# Patient Record
Sex: Male | Born: 1975 | Race: White | Hispanic: No | Smoking: Former smoker
Health system: Southern US, Community
[De-identification: ages and names within clinical notes are randomized; demographics above are authoritative.]

## PROBLEM LIST (undated history)

## (undated) DIAGNOSIS — R011 Cardiac murmur, unspecified: Secondary | ICD-10-CM

## (undated) DIAGNOSIS — Q231 Congenital insufficiency of aortic valve: Secondary | ICD-10-CM

## (undated) DIAGNOSIS — K219 Gastro-esophageal reflux disease without esophagitis: Secondary | ICD-10-CM

## (undated) DIAGNOSIS — Q2381 Bicuspid aortic valve: Secondary | ICD-10-CM

## (undated) DIAGNOSIS — I10 Essential (primary) hypertension: Secondary | ICD-10-CM

## (undated) DIAGNOSIS — E669 Obesity, unspecified: Secondary | ICD-10-CM

## (undated) DIAGNOSIS — Q251 Coarctation of aorta: Secondary | ICD-10-CM

## (undated) DIAGNOSIS — E785 Hyperlipidemia, unspecified: Secondary | ICD-10-CM

## (undated) DIAGNOSIS — Z8679 Personal history of other diseases of the circulatory system: Secondary | ICD-10-CM

## (undated) HISTORY — DX: Personal history of other diseases of the circulatory system: Z86.79

## (undated) HISTORY — DX: Congenital insufficiency of aortic valve: Q23.1

## (undated) HISTORY — DX: Bicuspid aortic valve: Q23.81

## (undated) HISTORY — PX: WISDOM TOOTH EXTRACTION: SHX21

## (undated) HISTORY — DX: Hyperlipidemia, unspecified: E78.5

## (undated) HISTORY — DX: Coarctation of aorta: Q25.1

## (undated) HISTORY — PX: PERCUTANEOUS BALLOON VALVULOPLASTY: SHX270

## (undated) HISTORY — DX: Essential (primary) hypertension: I10

## (undated) HISTORY — DX: Obesity, unspecified: E66.9

## (undated) HISTORY — DX: Gastro-esophageal reflux disease without esophagitis: K21.9

## (undated) HISTORY — PX: OTHER SURGICAL HISTORY: SHX169

---
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
## 2016-11-20 ENCOUNTER — Other Ambulatory Visit: Payer: Self-pay | Admitting: Family

## 2016-11-20 ENCOUNTER — Other Ambulatory Visit: Payer: Self-pay | Admitting: Family Medicine

## 2016-11-20 DIAGNOSIS — R748 Abnormal levels of other serum enzymes: Secondary | ICD-10-CM

## 2019-09-16 ENCOUNTER — Other Ambulatory Visit (HOSPITAL_COMMUNITY): Payer: Self-pay | Admitting: Family Medicine

## 2019-09-16 DIAGNOSIS — R011 Cardiac murmur, unspecified: Secondary | ICD-10-CM

## 2019-09-24 ENCOUNTER — Other Ambulatory Visit: Payer: Self-pay

## 2019-09-24 ENCOUNTER — Ambulatory Visit (HOSPITAL_COMMUNITY): Payer: 59 | Attending: Cardiology

## 2019-09-24 DIAGNOSIS — R011 Cardiac murmur, unspecified: Secondary | ICD-10-CM | POA: Diagnosis present

## 2019-09-28 ENCOUNTER — Other Ambulatory Visit (HOSPITAL_COMMUNITY): Payer: Self-pay

## 2019-10-12 ENCOUNTER — Other Ambulatory Visit: Payer: Self-pay

## 2019-10-12 ENCOUNTER — Encounter: Payer: Self-pay | Admitting: Cardiology

## 2019-10-12 ENCOUNTER — Ambulatory Visit (INDEPENDENT_AMBULATORY_CARE_PROVIDER_SITE_OTHER): Payer: PRIVATE HEALTH INSURANCE | Admitting: Cardiology

## 2019-10-12 VITALS — BP 150/76 | HR 88 | Ht 66.0 in | Wt 233.0 lb

## 2019-10-12 DIAGNOSIS — Q251 Coarctation of aorta: Secondary | ICD-10-CM | POA: Diagnosis not present

## 2019-10-12 DIAGNOSIS — Q2381 Bicuspid aortic valve: Secondary | ICD-10-CM

## 2019-10-12 DIAGNOSIS — E782 Mixed hyperlipidemia: Secondary | ICD-10-CM

## 2019-10-12 DIAGNOSIS — I351 Nonrheumatic aortic (valve) insufficiency: Secondary | ICD-10-CM

## 2019-10-12 DIAGNOSIS — Z8774 Personal history of (corrected) congenital malformations of heart and circulatory system: Secondary | ICD-10-CM

## 2019-10-12 DIAGNOSIS — Q231 Congenital insufficiency of aortic valve: Secondary | ICD-10-CM | POA: Diagnosis not present

## 2019-10-12 DIAGNOSIS — I10 Essential (primary) hypertension: Secondary | ICD-10-CM

## 2019-10-12 HISTORY — DX: Personal history of (corrected) congenital malformations of heart and circulatory system: Z87.74

## 2019-10-12 HISTORY — DX: Essential (primary) hypertension: I10

## 2019-10-12 HISTORY — DX: Nonrheumatic aortic (valve) insufficiency: I35.1

## 2019-10-12 HISTORY — DX: Mixed hyperlipidemia: E78.2

## 2019-10-12 NOTE — Progress Notes (Signed)
Cardiology Office Note:    Date:  10/12/2019   ID:  Dean Molina, DOB 08/02/1976, MRN [id number]  PCP:  Tally Joe, MD  Cardiologist:  Thomasene Ripple, DO  Electrophysiologist:  None   Referring MD: Tally Joe, MD   The patient was referred for aortic regurgitation.  History of Present Illness:    Dean Molina is a 43 y.o. male with a complex of history that includes a repair of aortic coarctation at 72 months old, Bicuspid aortic valve, Congenital pulmonic stenosis and is status post balloon valvuloplasty at age 1, hypertension and  Hyperlipidemia.   The patient is here today to be evaluated due to an abnormal echocardiogram. During his visit today he is aware of the aortic regurgitation. He denies any chest pain, shortness of breath,, lightheadedness or dizziness.   NO complaints today.   Past Medical History:  Diagnosis Date  . Acid reflux   . Bicuspid aortic valve   . Coarctation of the aorta, complex   . GERD (gastroesophageal reflux disease)   . Hyperlipidemia   . Hypertension   . Obesity (BMI 30-39.9)   . Personal history of pulmonic valve stenosis     Past Surgical History:  Procedure Laterality Date  . CARDIAC CATHETERIZATION  1988  . PERCUTANEOUS BALLOON VALVULOPLASTY    . status repair of coarctation of the aorta    . SUPRAVALVULAR AORTIC STENOSIS REPAIR  1977    Current Medications: Current Meds  Medication Sig  . GARLIC PO Take by mouth daily.  Marland Kitchen lovastatin (MEVACOR) 40 MG tablet 40 mg daily.  . Misc Natural Products (GINSENG COMPLEX PO) Take by mouth daily.  . Multiple Vitamins-Minerals (MULTIVITAMIN WITH MINERALS) tablet Take 1 tablet by mouth daily.  Marland Kitchen omeprazole (PRILOSEC) 20 MG capsule 20 mg daily.  . valsartan-hydrochlorothiazide (DIOVAN-HCT) 160-25 MG tablet daily.  . Zinc 25 MG TABS Take 25 mg by mouth daily.     Allergies:   Tamiflu [oseltamivir phosphate]   Social History   Socioeconomic History  . Marital status: Married   Spouse name: Not on file  . Number of children: Not on file  . Years of education: Not on file  . Highest education level: Not on file  Occupational History  . Not on file  Social Needs  . Financial resource strain: Not on file  . Food insecurity    Worry: Not on file    Inability: Not on file  . Transportation needs    Medical: Not on file    Non-medical: Not on file  Tobacco Use  . Smoking status: Former Smoker    Quit date: 10/11/2009    Years since quitting: 10.0  . Smokeless tobacco: Never Used  Substance and Sexual Activity  . Alcohol use: Not Currently    Comment: Rarely  . Drug use: Never  . Sexual activity: Not on file  Lifestyle  . Physical activity    Days per week: Not on file    Minutes per session: Not on file  . Stress: Not on file  Relationships  . Social Musician on phone: Not on file    Gets together: Not on file    Attends religious service: Not on file    Active member of club or organization: Not on file    Attends meetings of clubs or organizations: Not on file    Relationship status: Not on file  Other Topics Concern  . Not on file  Social History Narrative  .  Not on file     Family History: The patient's family history includes Arthritis in his mother; Diabetes in his father; Hypertension in his father and mother.  ROS:   Review of Systems  Constitution: Negative for decreased appetite, fever and weight gain.  HENT: Negative for congestion, ear discharge, hoarse voice and sore throat.   Eyes: Negative for discharge, redness, vision loss in right eye and visual halos.  Cardiovascular: Negative for chest pain, dyspnea on exertion, leg swelling, orthopnea and palpitations.  Respiratory: Negative for cough, hemoptysis, shortness of breath and snoring.   Endocrine: Negative for heat intolerance and polyphagia.  Hematologic/Lymphatic: Negative for bleeding problem. Does not bruise/bleed easily.  Skin: Negative for flushing, nail  changes, rash and suspicious lesions.  Musculoskeletal: Negative for arthritis, joint pain, muscle cramps, myalgias, neck pain and stiffness.  Gastrointestinal: Negative for abdominal pain, bowel incontinence, diarrhea and excessive appetite.  Genitourinary: Negative for decreased libido, genital sores and incomplete emptying.  Neurological: Negative for brief paralysis, focal weakness, headaches and loss of balance.  Psychiatric/Behavioral: Negative for altered mental status, depression and suicidal ideas.  Allergic/Immunologic: Negative for HIV exposure and persistent infections.    EKGs/Labs/Other Studies Reviewed:    The following studies were reviewed today:   EKG:  The ekg ordered today demonstrates Sinus rhythm, Hr 83 bpm with left sided interventricular conduct delay.  Transthoracic echocardiogram: 09/24/2019  1. Left ventricular ejection fraction, by visual estimation, is 55 to 60%. The left ventricle has normal function. There is no left ventricular hypertrophy.  2. Left ventricular diastolic function could not be evaluated.  3. Moderately dilated left ventricular internal cavity size.  4. Global right ventricle has normal systolic function.The right ventricular size is normal.  5. Left atrial size was normal.  6. Right atrial size was normal.  7. The mitral valve is normal in structure. Trace mitral valve regurgitation. No evidence of mitral stenosis.  8. The tricuspid valve is normal in structure. Tricuspid valve regurgitation is trivial.  9. Aortic valve regurgitation is severe. 10. The aortic valve is bicuspid. Aortic valve regurgitation is severe. No evidence of aortic valve sclerosis or stenosis. 11. The pulmonic valve was normal in structure. Pulmonic valve regurgitation is trivial. 12. Aortic dilatation noted. 13. There is mild dilatation of the aortic root measuring 41 mm. 14. The inferior vena cava is normal in size with greater than 50% respiratory variability,  suggesting right atrial pressure of 3 mmHg. 15. Normal LV function; moderate LVE; mildly dilated aortic root; probable bicuspid aortic valve with severe eccentric AI; elevated velocity (2.3 m/s) in descending aorta; suggest CTA or MRA to R/O coarctation.   Recent Labs: No results found for requested labs within last 8760 hours.  Recent Lipid Panel No results found for: CHOL, TRIG, HDL, CHOLHDL, VLDL, LDLCALC, LDLDIRECT  Physical Exam:    VS:  BP (!) 150/76 (BP Location: Right Arm, Patient Position: Sitting, Cuff Size: Normal)   Pulse 88   Ht 5\' 6"  (1.676 m)   Wt 233 lb (105.7 kg)   SpO2 94%   BMI 37.61 kg/m     Wt Readings from Last 3 Encounters:  10/12/19 233 lb (105.7 kg)     GEN: Well nourished, well developed in no acute distress HEENT: Normal NECK: No JVD; No carotid bruits LYMPHATICS: No lymphadenopathy CARDIAC: S1S2 noted,RRR, 2/6 soft diastolic murmurs, rubs, gallops RESPIRATORY:  Clear to auscultation without rales, wheezing or rhonchi  ABDOMEN: Soft, non-tender, non-distended, +bowel sounds, no guarding. EXTREMITIES: No edema, No  cyanosis, no clubbing MUSCULOSKELETAL:  No edema; No deformity  SKIN: Warm and dry NEUROLOGIC:  Alert and oriented x 3, non-focal PSYCHIATRIC:  Normal affect, good insight  ASSESSMENT:    1. Nonrheumatic aortic valve insufficiency   2. Bicuspid aortic valve   3. Coarctation of the aorta, complex   4. S/P repair of coarctation of aorta    PLAN:    1. I was able to also independently review his TTE - he does have severe Aortic regurgitation. He is asymptomatic and his LVEF is greater that 50%, and his LV cavity size in ( LVEDD is 49mm). He is a patient with complex history. I will send him to be evaluated by CT surgery. He will need a CTA to evaluate his repaired coarctation of the aorta. I will wait until after he visit with CT surgery before obtaining his CTA. He may also need a RHC/LHC.   2. For now we will continue to work on keeping  his blood pressure with a target less that 130/80 mmhg.  He will remain on the Valsartan 160 mg and HCTZ 25 mg daily.   3. His TTE will be repeat in 3-6 months if no plans for intervention.    The patient is in agreement with the above plan. The patient left the office in stable condition.  The patient will follow up in 3 months.   Medication Adjustments/Labs and Tests Ordered: Current medicines are reviewed at length with the patient today.  Concerns regarding medicines are outlined above.  Orders Placed This Encounter  Procedures  . Ambulatory referral to Cardiothoracic Surgery  . EKG 12-Lead   No orders of the defined types were placed in this encounter.   Patient Instructions  Medication Instructions:  Your physician recommends that you continue on your current medications as directed. Please refer to the Current Medication list given to you today.  *If you need a refill on your cardiac medications before your next appointment, please call your pharmacy*  Lab Work None  If you have labs (blood work) drawn today and your tests are completely normal, you will receive your results only by: Marland Kitchen MyChart Message (if you have MyChart) OR . A paper copy in the mail If you have any lab test that is abnormal or we need to change your treatment, we will call you to review the results.  Testing/Procedures: None  Follow-Up: At [facility], you and your health needs are our priority.  As part of our continuing mission to provide you with exceptional heart care, we have created designated Provider Care Teams.  These Care Teams include your primary Cardiologist (physician) and Advanced Practice Providers (APPs -  Physician Assistants and Nurse Practitioners) who all work together to provide you with the care you need, when you need it.  Your next appointment:   3 month(s)  The format for your next appointment:   In Person  Provider:   Berniece Salines, DO  Other Instructions You are  being referred to Dr Roxy Manns a cardiothoracic surgeon. They will call you with a date and time for an appointment.     Adopting a Healthy Lifestyle.  Know what a healthy weight is for you (roughly BMI <25) and aim to maintain this   Aim for 7+ servings of fruits and vegetables daily   65-80+ fluid ounces of water or unsweet tea for healthy kidneys   Limit to max 1 drink of alcohol per day; avoid smoking/tobacco   Limit animal fats in  diet for cholesterol and heart health - choose grass fed whenever available   Avoid highly processed foods, and foods high in saturated/trans fats   Aim for low stress - take time to unwind and care for your mental health   Aim for 150 min of moderate intensity exercise weekly for heart health, and weights twice weekly for bone health   Aim for 7-9 hours of sleep daily   When it comes to diets, agreement about the perfect plan isnt easy to find, even among the experts. Experts at the [facility] [employer] developed an idea known as the Healthy Eating Plate. Just imagine a plate divided into logical, healthy portions.   The emphasis is on diet quality:   Load up on vegetables and fruits - one-half of your plate: Aim for color and variety, and remember that potatoes dont count.   Go for whole grains - one-quarter of your plate: Whole wheat, barley, wheat berries, quinoa, oats, brown rice, and foods made with them. If you Molina pasta, go with whole wheat pasta.   Protein power - one-quarter of your plate: Fish, chicken, beans, and nuts are all healthy, versatile protein sources. Limit red meat.   The diet, however, does go beyond the plate, offering a few other suggestions.   Use healthy plant oils, such as olive, canola, soy, corn, sunflower and peanut. Check the labels, and avoid partially hydrogenated oil, which have unhealthy trans fats.   If youre thirsty, drink water. Coffee and tea are good in moderation, but skip sugary drinks and  limit milk and dairy products to one or two daily servings.   The type of carbohydrate in the diet is more important than the amount. Some sources of carbohydrates, such as vegetables, fruits, whole grains, and beans-are healthier than others.   Finally, stay active  Signed, Thomasene Ripple, DO  10/12/2019 8:15 PM    [facility]

## 2019-10-12 NOTE — Patient Instructions (Signed)
Medication Instructions:  Your physician recommends that you continue on your current medications as directed. Please refer to the Current Medication list given to you today.  *If you need a refill on your cardiac medications before your next appointment, please call your pharmacy*  Lab Work None  If you have labs (blood work) drawn today and your tests are completely normal, you will receive your results only by: Marland Kitchen MyChart Message (if you have MyChart) OR . A paper copy in the mail If you have any lab test that is abnormal or we need to change your treatment, we will call you to review the results.  Testing/Procedures: None  Follow-Up: At [facility], you and your health needs are our priority.  As part of our continuing mission to provide you with exceptional heart care, we have created designated Provider Care Teams.  These Care Teams include your primary Cardiologist (physician) and Advanced Practice Providers (APPs -  Physician Assistants and Nurse Practitioners) who all work together to provide you with the care you need, when you need it.  Your next appointment:   3 month(s)  The format for your next appointment:   In Person  Provider:   Berniece Salines, DO  Other Instructions You are being referred to Dr Roxy Manns a cardiothoracic surgeon. They will call you with a date and time for an appointment.

## 2019-10-13 ENCOUNTER — Telehealth: Payer: Self-pay

## 2019-10-13 NOTE — Telephone Encounter (Signed)
NOTES ON FILE FROM [facility] [phone], REFERRAL SENT TO SCHEDULING

## 2019-11-12 ENCOUNTER — Telehealth: Payer: Self-pay | Admitting: Cardiology

## 2019-11-12 ENCOUNTER — Telehealth: Payer: Self-pay | Admitting: *Deleted

## 2019-11-12 NOTE — Telephone Encounter (Signed)
message in regards to referral from provider:TOBB,Kardie, the deparitment was not entered for [facility] so referral did not show up in [employer]que.  We were not aware of this referral until we received staff message.

## 2019-11-12 NOTE — Telephone Encounter (Signed)
Spoke with [facility]. The referral did not interface. They will call him to arrange appointment.  Called to update the patient. Called confirmed number on file. Someone picked up the phone and said the number was wrong.  Called patient's wife, EC on file.  Left message to have the patient call back to discuss referral and to update phone number.

## 2019-11-12 NOTE — Telephone Encounter (Signed)
Message sent to [facility] for update on referral.

## 2019-11-12 NOTE — Telephone Encounter (Signed)
Patient is asking about a referral for surgery

## 2019-11-13 NOTE — Telephone Encounter (Signed)
The patient has been scheduled with Dr. Cornelius Moras 11/30/19.

## 2019-11-30 ENCOUNTER — Telehealth (HOSPITAL_COMMUNITY): Payer: Self-pay | Admitting: Emergency Medicine

## 2019-11-30 ENCOUNTER — Institutional Professional Consult (permissible substitution): Payer: 59 | Admitting: Thoracic Surgery (Cardiothoracic Vascular Surgery)

## 2019-11-30 ENCOUNTER — Other Ambulatory Visit: Payer: Self-pay

## 2019-11-30 ENCOUNTER — Encounter: Payer: Self-pay | Admitting: Thoracic Surgery (Cardiothoracic Vascular Surgery)

## 2019-11-30 ENCOUNTER — Other Ambulatory Visit: Payer: Self-pay | Admitting: Thoracic Surgery (Cardiothoracic Vascular Surgery)

## 2019-11-30 ENCOUNTER — Telehealth: Payer: Self-pay | Admitting: *Deleted

## 2019-11-30 VITALS — BP 175/77 | HR 79 | Temp 97.9°F | Resp 20 | Ht 66.0 in | Wt 230.0 lb

## 2019-11-30 DIAGNOSIS — I350 Nonrheumatic aortic (valve) stenosis: Secondary | ICD-10-CM | POA: Diagnosis not present

## 2019-11-30 DIAGNOSIS — Q231 Congenital insufficiency of aortic valve: Secondary | ICD-10-CM

## 2019-11-30 DIAGNOSIS — I351 Nonrheumatic aortic (valve) insufficiency: Secondary | ICD-10-CM | POA: Diagnosis not present

## 2019-11-30 NOTE — Telephone Encounter (Signed)
error 

## 2019-11-30 NOTE — Telephone Encounter (Signed)
Spoke with the patient. He will need a TEE. He cannot do 1/28. He stated that he would prefer to have it the week of February 8th due to his schedule.

## 2019-11-30 NOTE — Patient Instructions (Signed)

## 2019-11-30 NOTE — Progress Notes (Signed)
I can do on Thursday 01/28. Should have time to get his COVID screen by then. Misty Stanley, can you please schedule?

## 2019-11-30 NOTE — Progress Notes (Signed)
[address]       [place] [address]             [phone]     CARDIOTHORACIC SURGERY CONSULTATION REPORT  Referring Provider is Berniece Salines, DO PCP is Antony Contras, MD  Chief Complaint  Patient presents with  . 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  Patient underwent primary repair of coarctation of the aorta via left thoracotomy at age 81 months at [facility].  He was noted to have bicuspid aortic valve and was followed intermittently through childhood at [employer] as the patient's father was active duty.  At age 42 the patient underwent balloon valvuloplasty of the pulmonic valve at [facility].  He has not had formal cardiac follow-up for quite some time and was recently referred to Dr. Harriet Masson for consultation.  Transthoracic echocardiogram performed September 24, 2019 revealed findings consistent with likely bicuspid aortic valve with severe aortic insufficiency.  Cardiothoracic surgical consultation was requested.  Patient is married but recently separated from his wife.  He lives alone locally in [facility].  He works full-time as a Dance movement psychotherapist for [employer].  He does not exercise on a regular basis.  He has been moderately obese for the majority of his adult life.  He enjoys playing the guitar.  He does not have any children.  He denies any particular physical limitations.  He states that he has noted some tendency for decreased energy but  he denies any symptoms of exertional shortness of breath.  He has had some mild lower extremity edema which has been attributed to varicose veins and improved with use of compression stockings.  He denies any history of chest pain or chest tightness either with activity or at rest.  He reports occasional palpitations without dizzy spells or syncope.  He reports no specific physical limitations.  He does sleep in a recliner because of symptoms of reflux.  Past Medical History:  Diagnosis Date  . Acid reflux   . Aortic insufficiency due to bicuspid aortic valve   . Bicuspid aortic valve   . Coarctation of the aorta, s/p repair   . GERD (gastroesophageal reflux disease)   . Hyperlipidemia   . Hypertension   . Obesity (BMI 30-39.9)   . pulmonic valve stenosis s/p balloon valvuloplasty     Past Surgical History:  Procedure Laterality Date  . CARDIAC CATHETERIZATION  1988  . PERCUTANEOUS BALLOON VALVULOPLASTY     pulmonic valve  . status repair of coarctation of the aorta    . SUPRAVALVULAR AORTIC STENOSIS REPAIR  1977    Family History  Problem Relation Age of Onset  . Hypertension Mother   . Arthritis Mother   . Diabetes Father   . Hypertension Father     Social History   Socioeconomic History  . Marital status: Legally Separated    Spouse name: Not on file  . Number of children: Not on file  . Years of education: Not on file  . Highest  education level: Not on file  Occupational History  . Not on file  Tobacco Use  . Smoking status: Former Smoker    Quit date: 10/11/2009    Years since quitting: 10.1  . Smokeless tobacco: Never Used  Substance and Sexual Activity  . Alcohol use: Not Currently    Comment: Rarely  . Drug use: Never  . Sexual activity: Not on file  Other Topics Concern  . Not on file  Social History Narrative  . Not on file   Social Determinants of Health   Financial Resource Strain:   . Difficulty of Paying Living Expenses: Not on file  Food  Insecurity:   . Worried About Programme researcher, broadcasting/film/videounning Out of Food in the Last Year: Not on file  . Ran Out of Food in the Last Year: Not on file  Transportation Needs:   . Lack of Transportation (Medical): Not on file  . Lack of Transportation (Non-Medical): Not on file  Physical Activity:   . Days of Exercise per Week: Not on file  . Minutes of Exercise per Session: Not on file  Stress:   . Feeling of Stress : Not on file  Social Connections:   . Frequency of Communication with Friends and Family: Not on file  . Frequency of Social Gatherings with Friends and Family: Not on file  . Attends Religious Services: Not on file  . Active Member of Clubs or Organizations: Not on file  . Attends BankerClub or Organization Meetings: Not on file  . Marital Status: Not on file  Intimate Partner Violence:   . Fear of Current or Ex-Partner: Not on file  . Emotionally Abused: Not on file  . Physically Abused: Not on file  . Sexually Abused: Not on file    Current Outpatient Medications  Medication Sig Dispense Refill  . GARLIC PO Take by mouth daily.    Marland Kitchen. lovastatin (MEVACOR) 40 MG tablet 40 mg daily.    . Misc Natural Products (GINSENG COMPLEX PO) Take by mouth daily.    . Multiple Vitamins-Minerals (MULTIVITAMIN WITH MINERALS) tablet Take 1 tablet by mouth daily.    Marland Kitchen. omeprazole (PRILOSEC) 20 MG capsule 20 mg daily.    . valsartan-hydrochlorothiazide (DIOVAN-HCT) 160-25 MG tablet daily.    . Zinc 25 MG TABS Take 50 mg by mouth daily.      No current facility-administered medications for this visit.    Allergies  Allergen Reactions  . Tamiflu [Oseltamivir Phosphate] Nausea And Vomiting      Review of Systems:   General:  normal appetite, decreased energy, no weight gain, no weight loss, no fever  Cardiac:  no chest pain with exertion, no chest pain at rest, no SOB with exertion, no resting SOB, no PND, no orthopnea, + occasional palpitations, no arrhythmia, no atrial fibrillation, mild LE edema, no  dizzy spells, no syncope  Respiratory:  no shortness of breath, no home oxygen, no productive cough, no dry cough, no bronchitis, no wheezing, no hemoptysis, no asthma, no pain with inspiration or cough, no sleep apnea but + snoring, no CPAP at night  GI:   no difficulty swallowing, + reflux, no frequent heartburn, no hiatal hernia, no abdominal pain, no constipation, no diarrhea, no hematochezia, no hematemesis, no melena  GU:   no dysuria,  no frequency, no urinary tract infection, no hematuria, no enlarged prostate, no kidney stones, no kidney disease  Vascular:  no pain suggestive of claudication, no pain in feet, no leg cramps, + varicose veins,  no DVT, no non-healing foot ulcer  Neuro:   no stroke, no TIA's, no seizures, no headaches, no temporary blindness one eye,  no slurred speech, no peripheral neuropathy, no chronic pain, no instability of gait, no memory/cognitive dysfunction  Musculoskeletal: no arthritis, no joint swelling, no myalgias, no difficulty walking, normal mobility   Skin:   no rash, no itching, no skin infections, no pressure sores or ulcerations  Psych:   no anxiety, no depression, no nervousness, + unusual recent stress  Eyes:   no blurry vision, no floaters, no recent vision changes, + wears glasses or contacts  ENT:   no hearing loss, no loose or painful teeth, no dentures, last saw dentist within the past 6 months  Hematologic:  no easy bruising, no abnormal bleeding, no clotting disorder, no frequent epistaxis  Endocrine:  no diabetes, does not check CBG's at home     Physical Exam:   BP (!) 175/77   Pulse 79   Temp 97.9 F (36.6 C) (Skin)   Resp 20   Ht 5\' 6"  (1.676 m)   Wt 230 lb (104.3 kg)   SpO2 95% Comment: RA  BMI 37.12 kg/m   General:  Moderately obese,  well-appearing  HEENT:  Unremarkable   Neck:   no JVD, no bruits, no adenopathy   Chest:   clear to auscultation, symmetrical breath sounds, no wheezes, no rhonchi   CV:   RRR, grade II-III/VI  systolic and diastolic murmur   Abdomen:  soft, non-tender, no masses   Extremities:  warm, well-perfused, pulses palpable, no LE edema  Rectal/GU  Deferred  Neuro:   Grossly non-focal and symmetrical throughout  Skin:   Clean and dry, no rashes, no breakdown   Diagnostic Tests:    ECHOCARDIOGRAM REPORT       Patient Name:   Dean Molina Date of Exam: 09/24/2019 Medical Rec #:  09/26/2019      Height:       66.0 in Accession #:    [id number]     Weight:       231.0 lb Date of Birth:  1976-02-18      BSA:          2.13 m Patient Age:    43 years       BP:           156/72 mmHg Patient Gender: M              HR:           93 bpm. Exam Location:  [address]  Procedure: 2D Echo, 3D Echo, Cardiac Doppler, Color Doppler and Strain Analysis  Indications:    R01.1 Murmur   History:        Patient has no prior history of Echocardiogram examinations.                 Risk Factors:Hypertension, Diabetes, Pre-diabetes. Obesity and                 Former Smoker. Possible coarctation of the aorta repair at 26                 months old per patient.   Sonographer:    4                 months, RDCS Referring Phys: [id number] DAVID SWAYNE  IMPRESSIONS    1. Left ventricular ejection fraction, by visual estimation, is 55 to 60%. The left ventricle has normal function. There is no left ventricular hypertrophy.  2. Left ventricular diastolic function could not be  evaluated.  3. Moderately dilated left ventricular internal cavity size.  4. Global right ventricle has normal systolic function.The right ventricular size is normal.  5. Left atrial size was normal.  6. Right atrial size was normal.  7. The mitral valve is normal in structure. Trace mitral valve regurgitation. No evidence of mitral stenosis.  8. The tricuspid valve is normal in structure. Tricuspid valve regurgitation is trivial.  9. Aortic valve regurgitation is severe. 10. The aortic valve is bicuspid. Aortic valve regurgitation is  severe. No evidence of aortic valve sclerosis or stenosis. 11. The pulmonic valve was normal in structure. Pulmonic valve regurgitation is trivial. 12. Aortic dilatation noted. 13. There is mild dilatation of the aortic root measuring 41 mm. 14. The inferior vena cava is normal in size with greater than 50% respiratory variability, suggesting right atrial pressure of 3 mmHg. 15. Normal LV function; moderate LVE; mildly dilated aortic root; probable bicuspid aortic valve with severe eccentric AI; elevated velocity (2.3 m/s) in descending aorta; suggest CTA or MRA to R/O coarctation.  FINDINGS  Left Ventricle: Left ventricular ejection fraction, by visual estimation, is 55 to 60%. The left ventricle has normal function. The left ventricular internal cavity size was moderately dilated left ventricle. There is no left ventricular hypertrophy.  Left ventricular diastolic function could not be evaluated. Normal left atrial pressure.  Right Ventricle: The right ventricular size is normal. No increase in right ventricular wall thickness. Global RV systolic function is has normal systolic function.  Left Atrium: Left atrial size was normal in size.  Right Atrium: Right atrial size was normal in size  Pericardium: There is no evidence of pericardial effusion.  Mitral Valve: The mitral valve is normal in structure. No evidence of mitral valve stenosis by observation. Trace mitral valve regurgitation.  Tricuspid Valve: The tricuspid valve is normal in structure. Tricuspid valve regurgitation is trivial.  Aortic Valve: The aortic valve is bicuspid. Aortic valve regurgitation is severe. Aortic regurgitation PHT measures 231 msec. The aortic valve is structurally normal, with no evidence of sclerosis or stenosis.  Pulmonic Valve: The pulmonic valve was normal in structure. Pulmonic valve regurgitation is trivial.  Aorta: Aortic dilatation noted. There is mild dilatation of the aortic root  measuring 41 mm.  Venous: The inferior vena cava is normal in size with greater than 50% respiratory variability, suggesting right atrial pressure of 3 mmHg.  IAS/Shunts: No atrial level shunt detected by color flow Doppler. No ventricular septal defect is seen or detected. There is no evidence of an atrial septal defect.     LEFT VENTRICLE PLAX 2D LVIDd:         6.40 cm LVIDs:         5.20 cm  2D Longitudinal Strain LV PW:         0.70 cm  2D Strain GLS (A2C):   -11.4 % LV IVS:        1.20 cm  2D Strain GLS [facility](A3C):   -17.5 % LVOT diam:     2.40 cm  2D Strain GLS (A4C):   -9.8 % LV SV:         79 ml    2D Strain GLS Avg:     -12.9 % LV SV Index:   35.03 LVOT Area:     4.52 cm                           3D Volume EF:  3D EF:        37 %                         LV EDV:       263 ml                         LV ESV:       165 ml                         LV SV:        99 ml  RIGHT VENTRICLE RV Basal diam:  2.60 cm RV S prime:     9.36 cm/s  LEFT ATRIUM             Index       RIGHT ATRIUM           Index LA diam:        3.70 cm 1.74 cm/m  RA Pressure: 3.00 mmHg LA Vol (A2C):   51.6 ml 24.27 ml/m RA Area:     11.80 cm LA Vol (A4C):   28.5 ml 13.40 ml/m RA Volume:   23.70 ml  11.15 ml/m LA Biplane Vol: 38.9 ml 18.29 ml/m  AORTIC VALVE LVOT Vmax:   142.00 cm/s LVOT Vmean:  86.500 cm/s LVOT VTI:    0.304 m AI PHT:      231 msec   AORTA Ao Root diam: 4.10 cm Ao Asc diam:  3.40 cm  TRICUSPID VALVE Estimated RAP:  3.00 mmHg   SHUNTS Systemic VTI:  0.30 m Systemic Diam: 2.40 cm    Olga Millers MD Electronically signed by Olga Millers MD Signature Date/Time: 09/24/2019/12:45:08 PM      Impression:  I have personally reviewed the patient's recent transthoracic echocardiogram.  He has what appears to be a Sievers type I bicuspid aortic valve with stage C severe asymptomatic aortic insufficiency.  Left ventricular systolic function was  reported as normal although the left ventricle is noticeably dilated with left ventricular end-diastolic diameter reported 6.4 cm and long axis views of the left ventricle suggestive of the possibility of significant global left ventricular systolic dysfunction.  The jet of aortic insufficiency is quite eccentric and wraps around the left ventricle.  Patient denies any symptoms of exertional shortness of breath although he admits that he does not exercise on a regular basis.  We do not have any old echocardiograms to compare with regards to whether or not left ventricular dimensions are stable or increasing in size.  Options include continued medical therapy with close observation versus proceeding with further diagnostic testing to consider definitive surgical intervention.  I would recommend noninvasive stress testing such as formal cardiopulmonary exercise testing if continued observation on medical therapy were to be planned.  Based upon review of the patient's recent echocardiogram it appears clear that patient will need surgical intervention at some point within the next few years, and I would argue that he should have surgery sooner rather than later if he did poorly with cardiopulmonary stress testing.  If the patient were to proceed with surgery in the near future he may be a good candidate for an attempt at valve repair rather than replacement.     Plan:  The patient and his parents (who listened in via telephone) were counseled at length regarding treatment alternatives for management of severe aortic insufficiency including continued medical  therapy versus proceeding with aortic valve repair or replacement in the near future.  The natural history of aortic insufficiency was reviewed, as was long term prognosis with medical therapy alone.  The role of noninvasive stress testing was discussed.  Surgical options were discussed at length including conventional surgical aortic valve replacement  through either a full median sternotomy or using minimally invasive techniques.  Other alternatives including valve repair, the Ross autograft procedure, and transcatheter aortic valve replacement (TAVR) were discussed.  The potential advantages of valve repair were discussed.  If the patient's valve could not be repaired, discussion was held comparing the relative risks of mechanical valve replacement with need for lifelong anticoagulation versus use of a bioprosthetic tissue valve and the associated potential for late structural valve deterioration and failure.    The patient is interested in considering elective surgical intervention at some point within the next 3 to 6 months depending upon circumstances related to the COVID-19 pandemic.  As a next step the patient will undergo transesophageal echocardiogram to further evaluate the functional anatomy of the aortic valve including the potential feasibility of valve repair.  The patient will also undergo cardiac gated CT angiogram of the heart and coronary arteries as well as CT angiogram of the chest, abdomen, and pelvis.  He will return to our office for follow-up in approximately 4 weeks once these tests have been completed.   I spent in excess of 90 minutes during the conduct of this office consultation and >50% of this time involved direct face-to-face encounter with the patient for counseling and/or coordination of their care.    Salvatore Decent. Cornelius Moras, MD 11/30/2019 10:34 AM

## 2019-11-30 NOTE — H&P (View-Only) (Signed)
[address]       [place] [address]             [phone]     CARDIOTHORACIC SURGERY CONSULTATION REPORT  Referring Provider is Berniece Salines, DO PCP is Antony Contras, MD  Chief Complaint  Patient presents with  . 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  Patient underwent primary repair of coarctation of the aorta via left thoracotomy at age 46 months at [facility].  He was noted to have bicuspid aortic valve and was followed intermittently through childhood at [employer] as the patient's father was active duty.  At age 44 the patient underwent balloon valvuloplasty of the pulmonic valve at [facility].  He has not had formal cardiac follow-up for quite some time and was recently referred to Dr. Harriet Masson for consultation.  Transthoracic echocardiogram performed September 24, 2019 revealed findings consistent with likely bicuspid aortic valve with severe aortic insufficiency.  Cardiothoracic surgical consultation was requested.  Patient is married but recently separated from his wife.  He lives alone locally in [facility].  He works full-time as a Dance movement psychotherapist for [employer].  He does not exercise on a regular basis.  He has been moderately obese for the majority of his adult life.  He enjoys playing the guitar.  He does not have any children.  He denies any particular physical limitations.  He states that he has noted some tendency for decreased energy but  he denies any symptoms of exertional shortness of breath.  He has had some mild lower extremity edema which has been attributed to varicose veins and improved with use of compression stockings.  He denies any history of chest pain or chest tightness either with activity or at rest.  He reports occasional palpitations without dizzy spells or syncope.  He reports no specific physical limitations.  He does sleep in a recliner because of symptoms of reflux.  Past Medical History:  Diagnosis Date  . Acid reflux   . Aortic insufficiency due to bicuspid aortic valve   . Bicuspid aortic valve   . Coarctation of the aorta, s/p repair   . GERD (gastroesophageal reflux disease)   . Hyperlipidemia   . Hypertension   . Obesity (BMI 30-39.9)   . pulmonic valve stenosis s/p balloon valvuloplasty     Past Surgical History:  Procedure Laterality Date  . CARDIAC CATHETERIZATION  1988  . PERCUTANEOUS BALLOON VALVULOPLASTY     pulmonic valve  . status repair of coarctation of the aorta    . SUPRAVALVULAR AORTIC STENOSIS REPAIR  1977    Family History  Problem Relation Age of Onset  . Hypertension Mother   . Arthritis Mother   . Diabetes Father   . Hypertension Father     Social History   Socioeconomic History  . Marital status: Legally Separated    Spouse name: Not on file  . Number of children: Not on file  . Years of education: Not on file  . Highest  education level: Not on file  Occupational History  . Not on file  Tobacco Use  . Smoking status: Former Smoker    Quit date: 10/11/2009    Years since quitting: 10.1  . Smokeless tobacco: Never Used  Substance and Sexual Activity  . Alcohol use: Not Currently    Comment: Rarely  . Drug use: Never  . Sexual activity: Not on file  Other Topics Concern  . Not on file  Social History Narrative  . Not on file   Social Determinants of Health   Financial Resource Strain:   . Difficulty of Paying Living Expenses: Not on file  Food  Insecurity:   . Worried About Running Out of Food in the Last Year: Not on file  . Ran Out of Food in the Last Year: Not on file  Transportation Needs:   . Lack of Transportation (Medical): Not on file  . Lack of Transportation (Non-Medical): Not on file  Physical Activity:   . Days of Exercise per Week: Not on file  . Minutes of Exercise per Session: Not on file  Stress:   . Feeling of Stress : Not on file  Social Connections:   . Frequency of Communication with Friends and Family: Not on file  . Frequency of Social Gatherings with Friends and Family: Not on file  . Attends Religious Services: Not on file  . Active Member of Clubs or Organizations: Not on file  . Attends Club or Organization Meetings: Not on file  . Marital Status: Not on file  Intimate Partner Violence:   . Fear of Current or Ex-Partner: Not on file  . Emotionally Abused: Not on file  . Physically Abused: Not on file  . Sexually Abused: Not on file    Current Outpatient Medications  Medication Sig Dispense Refill  . GARLIC PO Take by mouth daily.    . lovastatin (MEVACOR) 40 MG tablet 40 mg daily.    . Misc Natural Products (GINSENG COMPLEX PO) Take by mouth daily.    . Multiple Vitamins-Minerals (MULTIVITAMIN WITH MINERALS) tablet Take 1 tablet by mouth daily.    . omeprazole (PRILOSEC) 20 MG capsule 20 mg daily.    . valsartan-hydrochlorothiazide (DIOVAN-HCT) 160-25 MG tablet daily.    . Zinc 25 MG TABS Take 50 mg by mouth daily.      No current facility-administered medications for this visit.    Allergies  Allergen Reactions  . Tamiflu [Oseltamivir Phosphate] Nausea And Vomiting      Review of Systems:   General:  normal appetite, decreased energy, no weight gain, no weight loss, no fever  Cardiac:  no chest pain with exertion, no chest pain at rest, no SOB with exertion, no resting SOB, no PND, no orthopnea, + occasional palpitations, no arrhythmia, no atrial fibrillation, mild LE edema, no  dizzy spells, no syncope  Respiratory:  no shortness of breath, no home oxygen, no productive cough, no dry cough, no bronchitis, no wheezing, no hemoptysis, no asthma, no pain with inspiration or cough, no sleep apnea but + snoring, no CPAP at night  GI:   no difficulty swallowing, + reflux, no frequent heartburn, no hiatal hernia, no abdominal pain, no constipation, no diarrhea, no hematochezia, no hematemesis, no melena  GU:   no dysuria,  no frequency, no urinary tract infection, no hematuria, no enlarged prostate, no kidney stones, no kidney disease  Vascular:  no pain suggestive of claudication, no pain in feet, no leg cramps, + varicose veins,   no DVT, no non-healing foot ulcer  Neuro:   no stroke, no TIA's, no seizures, no headaches, no temporary blindness one eye,  no slurred speech, no peripheral neuropathy, no chronic pain, no instability of gait, no memory/cognitive dysfunction  Musculoskeletal: no arthritis, no joint swelling, no myalgias, no difficulty walking, normal mobility   Skin:   no rash, no itching, no skin infections, no pressure sores or ulcerations  Psych:   no anxiety, no depression, no nervousness, + unusual recent stress  Eyes:   no blurry vision, no floaters, no recent vision changes, + wears glasses or contacts  ENT:   no hearing loss, no loose or painful teeth, no dentures, last saw dentist within the past 6 months  Hematologic:  no easy bruising, no abnormal bleeding, no clotting disorder, no frequent epistaxis  Endocrine:  no diabetes, does not check CBG's at home     Physical Exam:   BP (!) 175/77   Pulse 79   Temp 97.9 F (36.6 C) (Skin)   Resp 20   Ht 5\' 6"  (1.676 m)   Wt 230 lb (104.3 kg)   SpO2 95% Comment: RA  BMI 37.12 kg/m   General:  Moderately obese,  well-appearing  HEENT:  Unremarkable   Neck:   no JVD, no bruits, no adenopathy   Chest:   clear to auscultation, symmetrical breath sounds, no wheezes, no rhonchi   CV:   RRR, grade II-III/VI  systolic and diastolic murmur   Abdomen:  soft, non-tender, no masses   Extremities:  warm, well-perfused, pulses palpable, no LE edema  Rectal/GU  Deferred  Neuro:   Grossly non-focal and symmetrical throughout  Skin:   Clean and dry, no rashes, no breakdown   Diagnostic Tests:    ECHOCARDIOGRAM REPORT       Patient Name:   Dean Molina Date of Exam: 09/24/2019 Medical Rec #:  09/26/2019      Height:       66.0 in Accession #:    [id number]     Weight:       231.0 lb Date of Birth:  1976-02-18      BSA:          2.13 m Patient Age:    43 years       BP:           156/72 mmHg Patient Gender: M              HR:           93 bpm. Exam Location:  [address]  Procedure: 2D Echo, 3D Echo, Cardiac Doppler, Color Doppler and Strain Analysis  Indications:    R01.1 Murmur   History:        Patient has no prior history of Echocardiogram examinations.                 Risk Factors:Hypertension, Diabetes, Pre-diabetes. Obesity and                 Former Smoker. Possible coarctation of the aorta repair at 26                 months old per patient.   Sonographer:    4                 months, RDCS Referring Phys: [id number] DAVID SWAYNE  IMPRESSIONS    1. Left ventricular ejection fraction, by visual estimation, is 55 to 60%. The left ventricle has normal function. There is no left ventricular hypertrophy.  2. Left ventricular diastolic function could not be  evaluated.  3. Moderately dilated left ventricular internal cavity size.  4. Global right ventricle has normal systolic function.The right ventricular size is normal.  5. Left atrial size was normal.  6. Right atrial size was normal.  7. The mitral valve is normal in structure. Trace mitral valve regurgitation. No evidence of mitral stenosis.  8. The tricuspid valve is normal in structure. Tricuspid valve regurgitation is trivial.  9. Aortic valve regurgitation is severe. 10. The aortic valve is bicuspid. Aortic valve regurgitation is  severe. No evidence of aortic valve sclerosis or stenosis. 11. The pulmonic valve was normal in structure. Pulmonic valve regurgitation is trivial. 12. Aortic dilatation noted. 13. There is mild dilatation of the aortic root measuring 41 mm. 14. The inferior vena cava is normal in size with greater than 50% respiratory variability, suggesting right atrial pressure of 3 mmHg. 15. Normal LV function; moderate LVE; mildly dilated aortic root; probable bicuspid aortic valve with severe eccentric AI; elevated velocity (2.3 m/s) in descending aorta; suggest CTA or MRA to R/O coarctation.  FINDINGS  Left Ventricle: Left ventricular ejection fraction, by visual estimation, is 55 to 60%. The left ventricle has normal function. The left ventricular internal cavity size was moderately dilated left ventricle. There is no left ventricular hypertrophy.  Left ventricular diastolic function could not be evaluated. Normal left atrial pressure.  Right Ventricle: The right ventricular size is normal. No increase in right ventricular wall thickness. Global RV systolic function is has normal systolic function.  Left Atrium: Left atrial size was normal in size.  Right Atrium: Right atrial size was normal in size  Pericardium: There is no evidence of pericardial effusion.  Mitral Valve: The mitral valve is normal in structure. No evidence of mitral valve stenosis by observation. Trace mitral valve regurgitation.  Tricuspid Valve: The tricuspid valve is normal in structure. Tricuspid valve regurgitation is trivial.  Aortic Valve: The aortic valve is bicuspid. Aortic valve regurgitation is severe. Aortic regurgitation PHT measures 231 msec. The aortic valve is structurally normal, with no evidence of sclerosis or stenosis.  Pulmonic Valve: The pulmonic valve was normal in structure. Pulmonic valve regurgitation is trivial.  Aorta: Aortic dilatation noted. There is mild dilatation of the aortic root  measuring 41 mm.  Venous: The inferior vena cava is normal in size with greater than 50% respiratory variability, suggesting right atrial pressure of 3 mmHg.  IAS/Shunts: No atrial level shunt detected by color flow Doppler. No ventricular septal defect is seen or detected. There is no evidence of an atrial septal defect.     LEFT VENTRICLE PLAX 2D LVIDd:         6.40 cm LVIDs:         5.20 cm  2D Longitudinal Strain LV PW:         0.70 cm  2D Strain GLS (A2C):   -11.4 % LV IVS:        1.20 cm  2D Strain GLS [facility](A3C):   -17.5 % LVOT diam:     2.40 cm  2D Strain GLS (A4C):   -9.8 % LV SV:         79 ml    2D Strain GLS Avg:     -12.9 % LV SV Index:   35.03 LVOT Area:     4.52 cm                           3D Volume EF:  3D EF:        37 %                         LV EDV:       263 ml                         LV ESV:       165 ml                         LV SV:        99 ml  RIGHT VENTRICLE RV Basal diam:  2.60 cm RV S prime:     9.36 cm/s  LEFT ATRIUM             Index       RIGHT ATRIUM           Index LA diam:        3.70 cm 1.74 cm/m  RA Pressure: 3.00 mmHg LA Vol (A2C):   51.6 ml 24.27 ml/m RA Area:     11.80 cm LA Vol (A4C):   28.5 ml 13.40 ml/m RA Volume:   23.70 ml  11.15 ml/m LA Biplane Vol: 38.9 ml 18.29 ml/m  AORTIC VALVE LVOT Vmax:   142.00 cm/s LVOT Vmean:  86.500 cm/s LVOT VTI:    0.304 m AI PHT:      231 msec   AORTA Ao Root diam: 4.10 cm Ao Asc diam:  3.40 cm  TRICUSPID VALVE Estimated RAP:  3.00 mmHg   SHUNTS Systemic VTI:  0.30 m Systemic Diam: 2.40 cm    Olga Millers MD Electronically signed by Olga Millers MD Signature Date/Time: 09/24/2019/12:45:08 PM      Impression:  I have personally reviewed the patient's recent transthoracic echocardiogram.  He has what appears to be a Sievers type I bicuspid aortic valve with stage C severe asymptomatic aortic insufficiency.  Left ventricular systolic function was  reported as normal although the left ventricle is noticeably dilated with left ventricular end-diastolic diameter reported 6.4 cm and long axis views of the left ventricle suggestive of the possibility of significant global left ventricular systolic dysfunction.  The jet of aortic insufficiency is quite eccentric and wraps around the left ventricle.  Patient denies any symptoms of exertional shortness of breath although he admits that he does not exercise on a regular basis.  We do not have any old echocardiograms to compare with regards to whether or not left ventricular dimensions are stable or increasing in size.  Options include continued medical therapy with close observation versus proceeding with further diagnostic testing to consider definitive surgical intervention.  I would recommend noninvasive stress testing such as formal cardiopulmonary exercise testing if continued observation on medical therapy were to be planned.  Based upon review of the patient's recent echocardiogram it appears clear that patient will need surgical intervention at some point within the next few years, and I would argue that he should have surgery sooner rather than later if he did poorly with cardiopulmonary stress testing.  If the patient were to proceed with surgery in the near future he may be a good candidate for an attempt at valve repair rather than replacement.     Plan:  The patient and his parents (who listened in via telephone) were counseled at length regarding treatment alternatives for management of severe aortic insufficiency including continued medical  therapy versus proceeding with aortic valve repair or replacement in the near future.  The natural history of aortic insufficiency was reviewed, as was long term prognosis with medical therapy alone.  The role of noninvasive stress testing was discussed.  Surgical options were discussed at length including conventional surgical aortic valve replacement  through either a full median sternotomy or using minimally invasive techniques.  Other alternatives including valve repair, the Ross autograft procedure, and transcatheter aortic valve replacement (TAVR) were discussed.  The potential advantages of valve repair were discussed.  If the patient's valve could not be repaired, discussion was held comparing the relative risks of mechanical valve replacement with need for lifelong anticoagulation versus use of a bioprosthetic tissue valve and the associated potential for late structural valve deterioration and failure.    The patient is interested in considering elective surgical intervention at some point within the next 3 to 6 months depending upon circumstances related to the COVID-19 pandemic.  As a next step the patient will undergo transesophageal echocardiogram to further evaluate the functional anatomy of the aortic valve including the potential feasibility of valve repair.  The patient will also undergo cardiac gated CT angiogram of the heart and coronary arteries as well as CT angiogram of the chest, abdomen, and pelvis.  He will return to our office for follow-up in approximately 4 weeks once these tests have been completed.   I spent in excess of 90 minutes during the conduct of this office consultation and >50% of this time involved direct face-to-face encounter with the patient for counseling and/or coordination of their care.    Salvatore Decent. Cornelius Moras, MD 11/30/2019 10:34 AM

## 2019-12-01 NOTE — Progress Notes (Signed)
I tried to get him on 1/28, but he cannot do that. He says he prefers week of 2/8 (so Velora Mediate it is!). Can you please schedule, Trish? Masson Nalepa

## 2019-12-08 ENCOUNTER — Telehealth: Payer: Self-pay | Admitting: Cardiovascular Disease

## 2019-12-08 NOTE — Telephone Encounter (Signed)
The patient has been made aware that he should be receiving a call to set this up today or tomorrow.

## 2019-12-08 NOTE — Telephone Encounter (Signed)
Patient following up per last telephone note on 11/30/19. He was wanting to schedule his TEE but did not see any orders put in.

## 2019-12-09 ENCOUNTER — Telehealth: Payer: Self-pay | Admitting: Cardiovascular Disease

## 2019-12-09 NOTE — Telephone Encounter (Signed)
Dean Molina is scheduled for TEE on 2/17 @9am  with Dr. . Pt will also have a COVID test done pre procedure on 2/15 @10 :50 am. Called patient to give him the details for COVID testing and TEE instructions. Pt was instructed to go to [facility] for . He was also instructed that he had to quarantine after getting his COVID test until TEE. He was also told he needed to be NPO after midnight on 2/16. He will need to arrive at [facility] admitting at 7 am on 2/17. He will need someone to drive him home after the procedure. All the pts questions were answered and he verbalized understanding of the instructions.

## 2019-12-09 NOTE — Progress Notes (Signed)
Thank you, Dean Molina.

## 2019-12-15 ENCOUNTER — Encounter (HOSPITAL_COMMUNITY): Payer: Self-pay

## 2019-12-16 ENCOUNTER — Telehealth (HOSPITAL_COMMUNITY): Payer: Self-pay | Admitting: Emergency Medicine

## 2019-12-16 NOTE — Telephone Encounter (Signed)
Reaching out to patient to offer assistance regarding upcoming cardiac imaging study; pt verbalizes understanding of appt date/time, parking situation and where to check in, pre-test NPO status and medications ordered, and verified current allergies; name and call back number provided for further questions should they arise Zaden Sako RN Navigator Cardiac Imaging [facility] [phone] office [phone] cell 

## 2019-12-17 ENCOUNTER — Ambulatory Visit (HOSPITAL_COMMUNITY)
Admission: RE | Admit: 2019-12-17 | Discharge: 2019-12-17 | Disposition: A | Discharge status: Home / Self Care | Payer: 59 | Source: Ambulatory Visit | Attending: Thoracic Surgery (Cardiothoracic Vascular Surgery) | Admitting: Thoracic Surgery (Cardiothoracic Vascular Surgery)

## 2019-12-17 ENCOUNTER — Other Ambulatory Visit: Payer: Self-pay

## 2019-12-17 DIAGNOSIS — I350 Nonrheumatic aortic (valve) stenosis: Secondary | ICD-10-CM | POA: Insufficient documentation

## 2019-12-17 MED ORDER — IOHEXOL 350 MG/ML SOLN
100.0000 mL | Freq: Once | INTRAVENOUS | Status: AC | PRN
  Administered 2019-12-17: 08:00:00 100 mL via INTRAVENOUS

## 2019-12-21 ENCOUNTER — Other Ambulatory Visit (HOSPITAL_COMMUNITY)
Admission: RE | Admit: 2019-12-21 | Discharge: 2019-12-21 | Disposition: A | Discharge status: Home / Self Care | Payer: 59 | Source: Ambulatory Visit | Attending: Cardiovascular Disease | Admitting: Cardiovascular Disease

## 2019-12-21 DIAGNOSIS — Z20822 Contact with and (suspected) exposure to covid-19: Secondary | ICD-10-CM | POA: Diagnosis not present

## 2019-12-21 DIAGNOSIS — Z01812 Encounter for preprocedural laboratory examination: Secondary | ICD-10-CM | POA: Diagnosis present

## 2019-12-21 LAB — SARS CORONAVIRUS 2 (TAT 6-24 HRS): SARS Coronavirus 2: NEGATIVE

## 2019-12-22 NOTE — Anesthesia Preprocedure Evaluation (Addendum)
Anesthesia Evaluation  Patient identified by MRN, date of birth, ID band Patient awake    Reviewed: Allergy & Precautions, H&P , NPO status , Patient's Chart, lab work & pertinent test results  Airway Mallampati: III  TM Distance: >3 FB Neck ROM: Full    Dental no notable dental hx. (+) Teeth Intact, Dental Advisory Given   Pulmonary neg pulmonary ROS, former smoker,    Pulmonary exam normal breath sounds clear to auscultation       Cardiovascular Exercise Tolerance: Good hypertension, Pt. on medications + Valvular Problems/Murmurs AI  Rhythm:Regular Rate:Normal     Neuro/Psych negative neurological ROS  negative psych ROS   GI/Hepatic Neg liver ROS, GERD  Medicated,  Endo/Other  Morbid obesity  Renal/GU negative Renal ROS  negative genitourinary   Musculoskeletal   Abdominal   Peds  Hematology negative hematology ROS (+)   Anesthesia Other Findings   Reproductive/Obstetrics negative OB ROS                            Anesthesia Physical Anesthesia Plan  ASA: III  Anesthesia Plan: MAC   Post-op Pain Management:    Induction: Intravenous  PONV Risk Score and Plan: 1 and Propofol infusion  Airway Management Planned: Nasal Cannula  Additional Equipment:   Intra-op Plan:   Post-operative Plan:   Informed Consent: I have reviewed the patients History and Physical, chart, labs and discussed the procedure including the risks, benefits and alternatives for the proposed anesthesia with the patient or authorized representative who has indicated his/her understanding and acceptance.     Dental advisory given  Plan Discussed with: CRNA  Anesthesia Plan Comments:         Anesthesia Quick Evaluation

## 2019-12-23 ENCOUNTER — Other Ambulatory Visit: Payer: Self-pay

## 2019-12-23 ENCOUNTER — Encounter (HOSPITAL_COMMUNITY): Payer: Self-pay | Admitting: Cardiovascular Disease

## 2019-12-23 ENCOUNTER — Ambulatory Visit (HOSPITAL_COMMUNITY): Payer: 59 | Admitting: Anesthesiology

## 2019-12-23 ENCOUNTER — Ambulatory Visit (HOSPITAL_BASED_OUTPATIENT_CLINIC_OR_DEPARTMENT_OTHER)
Admission: RE | Admit: 2019-12-23 | Discharge: 2019-12-23 | Disposition: A | Discharge status: Home / Self Care | Payer: 59 | Source: Home / Self Care | Attending: Cardiovascular Disease | Admitting: Cardiovascular Disease

## 2019-12-23 ENCOUNTER — Encounter (HOSPITAL_COMMUNITY)
Admission: RE | Disposition: A | Discharge status: Home / Self Care | Payer: 59 | Source: Home / Self Care | Attending: Cardiovascular Disease

## 2019-12-23 ENCOUNTER — Ambulatory Visit (HOSPITAL_COMMUNITY)
Admission: RE | Admit: 2019-12-23 | Discharge: 2019-12-23 | Disposition: A | Discharge status: Home / Self Care | Payer: 59 | Attending: Cardiovascular Disease | Admitting: Cardiovascular Disease

## 2019-12-23 DIAGNOSIS — Z87891 Personal history of nicotine dependence: Secondary | ICD-10-CM | POA: Diagnosis not present

## 2019-12-23 DIAGNOSIS — I081 Rheumatic disorders of both mitral and tricuspid valves: Secondary | ICD-10-CM | POA: Diagnosis not present

## 2019-12-23 DIAGNOSIS — E669 Obesity, unspecified: Secondary | ICD-10-CM | POA: Insufficient documentation

## 2019-12-23 DIAGNOSIS — Z6837 Body mass index (BMI) 37.0-37.9, adult: Secondary | ICD-10-CM | POA: Diagnosis not present

## 2019-12-23 DIAGNOSIS — E119 Type 2 diabetes mellitus without complications: Secondary | ICD-10-CM | POA: Diagnosis not present

## 2019-12-23 DIAGNOSIS — I351 Nonrheumatic aortic (valve) insufficiency: Secondary | ICD-10-CM | POA: Diagnosis not present

## 2019-12-23 DIAGNOSIS — E785 Hyperlipidemia, unspecified: Secondary | ICD-10-CM | POA: Diagnosis not present

## 2019-12-23 DIAGNOSIS — I340 Nonrheumatic mitral (valve) insufficiency: Secondary | ICD-10-CM | POA: Diagnosis not present

## 2019-12-23 DIAGNOSIS — I10 Essential (primary) hypertension: Secondary | ICD-10-CM | POA: Insufficient documentation

## 2019-12-23 DIAGNOSIS — Z8774 Personal history of (corrected) congenital malformations of heart and circulatory system: Secondary | ICD-10-CM | POA: Diagnosis not present

## 2019-12-23 DIAGNOSIS — Q231 Congenital insufficiency of aortic valve: Secondary | ICD-10-CM

## 2019-12-23 DIAGNOSIS — K219 Gastro-esophageal reflux disease without esophagitis: Secondary | ICD-10-CM | POA: Diagnosis not present

## 2019-12-23 DIAGNOSIS — Z79899 Other long term (current) drug therapy: Secondary | ICD-10-CM | POA: Diagnosis not present

## 2019-12-23 HISTORY — PX: TEE WITHOUT CARDIOVERSION: SHX5443

## 2019-12-23 SURGERY — ECHOCARDIOGRAM, TRANSESOPHAGEAL
Anesthesia: Monitor Anesthesia Care

## 2019-12-23 MED ORDER — PROPOFOL 500 MG/50ML IV EMUL
INTRAVENOUS | Status: DC | PRN
  Administered 2019-12-23: 100 ug/kg/min via INTRAVENOUS

## 2019-12-23 MED ORDER — LACTATED RINGERS IV SOLN
INTRAVENOUS | Status: AC | PRN
  Administered 2019-12-23: 1000 mL via INTRAVENOUS

## 2019-12-23 MED ORDER — SODIUM CHLORIDE 0.9 % IV SOLN: INTRAVENOUS | Status: DC

## 2019-12-23 MED ORDER — PROPOFOL 10 MG/ML IV BOLUS
INTRAVENOUS | Status: DC | PRN
  Administered 2019-12-23 (×2): 20 mg via INTRAVENOUS

## 2019-12-23 NOTE — Progress Notes (Addendum)
  Echocardiogram Echocardiogram Transesophageal has been performed.  Julann Mcgilvray A Linzi Ohlinger 12/23/2019, 9:40 AM

## 2019-12-23 NOTE — Anesthesia Postprocedure Evaluation (Signed)
Anesthesia Post Note  Patient: Dean Molina  Procedure(s) Performed: TRANSESOPHAGEAL ECHOCARDIOGRAM (TEE) (N/A )     Patient location during evaluation: Endoscopy Anesthesia Type: MAC Level of consciousness: awake and alert Pain management: pain level controlled Vital Signs Assessment: post-procedure vital signs reviewed and stable Respiratory status: spontaneous breathing, nonlabored ventilation and respiratory function stable Cardiovascular status: stable and blood pressure returned to baseline Postop Assessment: no apparent nausea or vomiting Anesthetic complications: no    Last Vitals:  Vitals:   12/23/19 0925 12/23/19 0935  BP: (!) 104/57 119/66  Pulse: 80 75  Resp: 18 18  Temp:    SpO2: 96% 94%    Last Pain:  Vitals:   12/23/19 0935  TempSrc:   PainSc: 0-No pain                 Athene Schuhmacher,W. EDMOND

## 2019-12-23 NOTE — Transfer of Care (Signed)
Immediate Anesthesia Transfer of Care Note  Patient: Dean Molina  Procedure(s) Performed: TRANSESOPHAGEAL ECHOCARDIOGRAM (TEE) (N/A )  Patient Location: Endoscopy Unit  Anesthesia Type:MAC  Level of Consciousness: awake, alert  and oriented  Airway & Oxygen Therapy: Patient Spontanous Breathing  Post-op Assessment: Report given to RN  Post vital signs: Reviewed and stable  Last Vitals:  Vitals Value Taken Time  BP 104/57 12/23/19 0922  Temp    Pulse 85 12/23/19 0923  Resp 16 12/23/19 0923  SpO2 94 % 12/23/19 0923  Vitals shown include unvalidated device data.  Last Pain:  Vitals:   12/23/19 0801  TempSrc: Oral  PainSc: 0-No pain         Complications: No apparent anesthesia complications

## 2019-12-23 NOTE — Op Note (Signed)
INDICATIONS: congenital heart disease and bicuspid aortic valve insufficiency, s/p coarctation repair  PROCEDURE:   Informed consent was obtained prior to the procedure. The risks, benefits and alternatives for the procedure were discussed and the patient comprehended these risks.  Risks include, but are not limited to, cough, sore throat, vomiting, nausea, somnolence, esophageal and stomach trauma or perforation, bleeding, low blood pressure, aspiration, pneumonia, infection, trauma to the teeth and death.    After a procedural time-out, the oropharynx was anesthetized with 20% benzocaine spray.   During this procedure the patient was administered IV propofol, Dr. Aleene Davidson.  The transesophageal probe was inserted in the esophagus and stomach without difficulty and multiple views were obtained.  The patient was kept under observation until the patient left the procedure room.  The patient left the procedure room in stable condition.   Agitated microbubble saline contrast was not administered.  COMPLICATIONS:    There were no immediate complications.  FINDINGS:  Bicuspid aortic valve. Severe aortic insufficiency. Coronary ostia aappear to be normal in location. The aortic arch and the descending aorta are relatively small in caliber. Mild aortic dilation at the site of previous coarctation repair.  RECOMMENDATIONS:    Proceed with surgical evaluation.  Time Spent Directly with the Patient:  45 minutes   Dean Molina 12/23/2019, 9:16 AM

## 2019-12-23 NOTE — Discharge Instructions (Signed)

## 2019-12-23 NOTE — Anesthesia Procedure Notes (Signed)
Procedure Name: MAC Date/Time: 12/23/2019 8:52 AM Performed by: Barrington Ellison, CRNA Pre-anesthesia Checklist: Patient identified, Emergency Drugs available, Suction available and Patient being monitored Patient Re-evaluated:Patient Re-evaluated prior to induction Oxygen Delivery Method: Nasal cannula

## 2019-12-23 NOTE — Interval H&P Note (Signed)
History and Physical Interval Note:  12/23/2019 8:09 AM  Dean Molina  has presented today for surgery, with the diagnosis of AORTIC STENOSIS.  The various methods of treatment have been discussed with the patient and family. After consideration of risks, benefits and other options for treatment, the patient has consented to  Procedure(s): TRANSESOPHAGEAL ECHOCARDIOGRAM (TEE) (N/A) as a surgical intervention.  The patient's history has been reviewed, patient examined, no change in status, stable for surgery.  I have reviewed the patient's chart and labs.  Questions were answered to the patient's satisfaction.     Camilla Skeen

## 2019-12-28 ENCOUNTER — Ambulatory Visit: Payer: 59 | Admitting: Thoracic Surgery (Cardiothoracic Vascular Surgery)

## 2019-12-28 ENCOUNTER — Encounter: Payer: Self-pay | Admitting: Thoracic Surgery (Cardiothoracic Vascular Surgery)

## 2019-12-28 ENCOUNTER — Other Ambulatory Visit: Payer: Self-pay

## 2019-12-28 VITALS — BP 170/79 | HR 77 | Temp 97.5°F | Resp 20 | Ht 66.0 in | Wt 239.0 lb

## 2019-12-28 DIAGNOSIS — I350 Nonrheumatic aortic (valve) stenosis: Secondary | ICD-10-CM

## 2019-12-28 DIAGNOSIS — I351 Nonrheumatic aortic (valve) insufficiency: Secondary | ICD-10-CM | POA: Diagnosis not present

## 2019-12-28 DIAGNOSIS — Q231 Congenital insufficiency of aortic valve: Secondary | ICD-10-CM

## 2019-12-28 NOTE — Progress Notes (Signed)
[address]       [place] [id number]             [phone]     CARDIOTHORACIC SURGERY OFFICE NOTE  Primary Cardiologist is Thomasene RippleKardie Tobb, DO PCP is Tally JoeSwayne, David, MD   HPI:  Patient is a 44 year old moderately obese male with history of congenital heart disease, hypertension, hyperlipidemia, and GE reflux disease who returns to the office today for further discuss treatment options for management of bicuspid aortic valve with stage C severe asymptomatic primary aortic insufficiency.  The patient was initially seen in consultation on November 30, 2019.  Since then he underwent TEE and cardiac gated CT angiogram of the heart.  He returns to the office today to review the results of these tests and discuss treatment options further.  He continues to report no symptoms of exertional shortness of breath or fatigue, although he admits that he does not exercise to any significant degree on a regular basis.   Current Outpatient Medications  Medication Sig Dispense Refill  . GARLIC PO Take 1 tablet by mouth daily.     Marland Kitchen. lovastatin (MEVACOR) 40 MG tablet Take 40 mg by mouth daily.     . Misc Natural Products (GINSENG COMPLEX PO) Take 1 tablet by mouth daily.     . Multiple Vitamins-Minerals (MULTIVITAMIN WITH MINERALS) tablet Take 1 tablet by mouth daily. With Zinc    . omeprazole (PRILOSEC) 20 MG capsule Take 20 mg by mouth daily.     Marland Kitchen. OVER THE COUNTER MEDICATION Take 1 tablet by mouth daily. Goli apple cider vinegar    . valsartan-hydrochlorothiazide (DIOVAN-HCT) 160-25 MG tablet Take 1 tablet by mouth daily.     . Zinc 50 MG CAPS Take 50 mg by mouth daily.      No current facility-administered medications for this visit.      Physical Exam:   BP (!) 170/79   Pulse 77   Temp (!) 97.5 F (36.4 C) (Skin)   Resp 20   Ht 5\' 6"  (1.676 m)   Wt 239 lb (108.4 kg)   SpO2 98% Comment: RA  BMI 38.58 kg/m   General:  Obese but well-appearing  Chest:   Clear to  auscultation  CV:   Regular rate and rhythm with prominent diastolic murmur  Incisions:  n/a  Abdomen:  Soft nontender  Extremities:  Warm and well-perfused  Diagnostic Tests:   TRANSESOPHOGEAL ECHO REPORT       Patient Name:  Dean Molina Date of Exam: 12/23/2019  Medical Rec #: [id number]   Height:    66.0 in  Accession #:  [id number]   Weight:    230.0 lb  Date of Birth: April 19, 1976   BSA:     2.12 m  Patient Age:  43 years    BP:      119/66 mmHg  Patient Gender: M       HR:      92 bpm.  Exam Location: Inpatient   Procedure: Transesophageal Echo   Indications:   Aortic valve insufficiency    History:     Patient has prior history of Echocardiogram examinations,  most          recent 09/24/2019. Bicuspid aortic valve; Risk          Factors:Hypertension and Dyslipidemia. S/P repair of          coarctation of aorta.    Sonographer:   Leeroy Bockhelsea Turrentine  Referring  Phys: 31 MIHAI CROITORU  Diagnosing Phys: Thurmon Fair MD   PROCEDURE: The transesophogeal probe was passed without difficulty through  the esophogus of the patient. Sedation performed by different physician.  The patient was monitored while under deep sedation. Anesthestetic  sedation was provided intravenously by  Anesthesiology: 40mg  of Propofol. The patient developed no complications  during the procedure.   IMPRESSIONS    1. Left ventricular ejection fraction, by estimation, is 60 to 65%. The  left ventricle has normal function. The left ventricle has no regional  wall motion abnormalities. The left ventricular internal cavity size was  mildly dilated. Left ventricular  diastolic function could not be evaluated.  2. Right ventricular systolic function is normal. The right ventricular  size is normal.  3. No left atrial/left atrial appendage thrombus was detected.  4. The mitral valve is normal in  structure and function. Mild mitral  valve regurgitation.  5. The aortic valve is bicuspid. Aortic valve regurgitation is severe.  Mild aortic valve sclerosis is present, with no evidence of aortic valve  stenosis.  6. The aortic annulus measures 2.5 cm. The aortic root (3.5 cm) and  proximal ascending aorta (2.9 cm) are normal in caliber. The mid-arch is  relatively smaller in caliber at 1.7 cm. The distal aortic arch is ectatic  at the site of previous coarctation  repair and left subclavian takeoff with a maximum diameter of 3.4 cm,  while the descending aorta is relatively small at 1.9 cm. While no true  coarctation is seen, there is a 50% "step-down" in aortic caliber. No  atheroma is seen. Aortic dilatation noted.   FINDINGS  Left Ventricle: Left ventricular ejection fraction, by estimation, is 60  to 65%. The left ventricle has normal function. The left ventricle has no  regional wall motion abnormalities. The left ventricular internal cavity  size was mildly dilated. There is  no left ventricular hypertrophy. Left ventricular diastolic function  could not be evaluated.   Right Ventricle: The right ventricular size is normal. No increase in  right ventricular wall thickness. Right ventricular systolic function is  normal.   Left Atrium: Left atrial size was normal in size. No left atrial/left  atrial appendage thrombus was detected.   Right Atrium: Right atrial size was normal in size.   Pericardium: There is no evidence of pericardial effusion.   Mitral Valve: The mitral valve is normal in structure and function. Mild  mitral valve regurgitation.   Tricuspid Valve: The tricuspid valve is normal in structure. Tricuspid  valve regurgitation is not demonstrated.   Aortic Valve: The aortic valve is bicuspid. Aortic valve regurgitation is  severe. Vena contracta 5 mm. Mild aortic valve sclerosis is present, with  no evidence of aortic valve stenosis. The coronary ostia  appear to be  located normally, although the right  coronary ostia was harder to identify.   Pulmonic Valve: The pulmonic valve was normal in structure. Pulmonic valve  regurgitation is trivial.   Aorta: The aortic annulus measures 2.5 cm. The aortic root (3.5 cm) and  proximal ascending aorta (2.9 cm) are normal in caliber. The mid-arch is  relatively smaller in caliber at 1.7 cm. The distal aortic arch is ectatic  at the site of previous  coarctation repair and left subclavian takeoff with a maximum diameter of  3.4 cm, while the descending aorta is relatively small at 1.9 cm. While no  true coarctation is seen, there is a 50% "step-down" in aortic caliber. No  atheroma is seen. Aortic  dilatation noted.   IAS/Shunts: No atrial level shunt detected by color flow Doppler.     LEFT VENTRICLE  PLAX 2D  LVOT diam:   2.53 cm  LVOT Area:   5.04 cm       AORTA  Ao Asc diam: 2.87 cm     SHUNTS  Systemic Diam: 2.53 cm   Rachelle Hora Croitoru MD  Electronically signed by Thurmon Fair MD  Signature Date/Time: 12/23/2019/4:02:33 PM       Cardiac TAVR CT  TECHNIQUE: The patient was scanned on a [employer]. A 120 kV retrospective scan was triggered in the descending thoracic aorta at 111 HU's. Gantry rotation speed was 250 msecs and collimation was .6 mm. No beta blockade or nitro were given. The 3D data set was reconstructed in 5% intervals of the R-R cycle. Systolic and diastolic phases were analyzed on a dedicated work station using MPR, MIP and VRT modes. The patient received 80 cc of contrast.  FINDINGS: Image quality: Average.  Noise artifact is: Mild signal to noise artifact due to obesity (BMI 38).  Valve Morphology: The aortic valve is bicuspid with fusion of the RCC/LCC. There appears to be a raphe present (Seivers type 1). There is no aortic valve leaflet calcification. There is severe prolapse of the RCC/LCC leaflet which is the  etiology of severe aortic regurgitation.  Aortic Valve Calcium score: 0  Aortic annular dimension:  Phase assessed: 30%  Annular area: 6.58 cm2  Annular perimeter: 96 mm  Max diameter: 35.6 mm  Min diameter: 25.6 mm  Annular and subannular calcification: No annular calcification. No subannular calcification.  Optimal coplanar projection:  LAO 9 CRA 2  Coronary Artery Height above Annulus:  Left Main: 8.0 mm  Right Coronary: 19 mm  Sinus of Valsalva Measurements:  Non-coronary: 35 mm  Right -coronary: 34 mm  Left -coronary: 32 mm  Sinus of Valsalva Height: 19.2 mm  Aorta:  Sinotubular Junction: 28 mm  Ascending Thoracic Aorta: 32 mm  Aortic Arch: Narrowing noted in the distal arch at the take off of the left subclavian, measuring 18 mm x 17 mm. This is concerning for re-coarctation given this patient's history of prior coarctation repair. Echocardiogram reviewed with peak gradient ~21 mmHG across this area, further supporting re-coarctation. I suspect this is a prior extended end-to-end anastomosis, as there is no evidence of prior stent or flap repair.  Descending Aorta: 27 mm directly after take off of left subclavian. No evidence of coarctation in the distal descending aorta.  Coronary Arteries: CAC score of 0. Normal coronary origin. Left dominance. The study was performed without use of NTG and is insufficient for plaque evaluation.  Cardiac Morphology:  Right Atrium: Right atrial size is within normal limits.  Right Ventricle: The right ventricular cavity is within normal limits.  Left Atrium: Left atrial size is normal in size with no left atrial appendage filling defect. Accessory left atrial appendage noted.  Left Ventricle: The ventricular cavity size is within normal limits. There are no stigmata of prior infarction. There is no abnormal filling defect. LVEF=54%.  Pulmonary arteries: Normal in size  without proximal filling defect.  Pulmonary veins: Normal pulmonary venous drainage.  Pericardium: Normal thickness with no significant effusion or calcium present.  Mitral Valve: The mitral valve is normal structure without significant calcification. Billowing noted in systole.  Extra-cardiac findings: See attached radiology report for non-cardiac structures.  IMPRESSION: 1. Bicuspid aortic valve with severe prolapse of the  fused RCC/LCC.  2. Narrowing (18 mm 17 mm) noted in the distal aortic arch at the left subclavian take-off concerning for possible re-coarctation in this patient with reported prior coarctation repair (echo gradient noted to be ~21 mmHG).  3. Annular measurements support a 29 mm Edwards Sapient 3, but the left main coronary height is borderline (8 mm). Optimal deployment angle above.  4. Coronary calcium score of 0. The study was performed without use of NTG and is insufficient for plaque evaluation.  Lennie Odor, MD   Electronically Signed   By: Lennie Odor   On: 12/17/2019 19:35   CT ANGIOGRAPHY CHEST, ABDOMEN AND PELVIS  TECHNIQUE: Multidetector CT imaging through the chest, abdomen and pelvis was performed using the standard protocol during bolus administration of intravenous contrast. Multiplanar reconstructed images and MIPs were obtained and reviewed to evaluate the vascular anatomy.  CONTRAST:  OMNIPAQUE IOHEXOL 350 MG/ML SOLN  COMPARISON:  None.  FINDINGS: CTA CHEST FINDINGS  Cardiovascular: Borderline mild cardiomegaly. No significant pericardial effusion/thickening. Note is made of mild narrowing of the aortic arch between the takeoffs of the left common carotid and left subclavian arteries. No thoracic aortic aneurysm. Aortic arch branch vessels are widely patent. Normal caliber pulmonary arteries. No central pulmonary emboli.  Mediastinum/Nodes: No discrete thyroid nodules. Unremarkable esophagus.  No pathologically enlarged axillary, mediastinal or hilar lymph nodes.  Lungs/Pleura: No pneumothorax. No pleural effusion. Mosaic attenuation throughout both lungs. No acute consolidative airspace disease, lung masses or significant pulmonary nodules. Diffuse bronchial wall thickening.  Musculoskeletal: No aggressive appearing focal osseous lesions. Moderate thoracic spondylosis.  CTA ABDOMEN AND PELVIS FINDINGS  Hepatobiliary: Normal liver with no liver mass. Normal gallbladder with no radiopaque cholelithiasis. No biliary ductal dilatation.  Pancreas: Normal, with no mass or duct dilation.  Spleen: Normal size. No mass.  Adrenals/Urinary Tract: Normal adrenals. No hydronephrosis. Subcentimeter hypodense renal cortical lesions in the upper and lower right kidney are too small to characterize and require no follow-up. No additional contour deforming renal lesions. Normal bladder.  Stomach/Bowel: Small to moderate hiatal hernia. Otherwise normal nondistended stomach. Normal caliber small bowel with no small bowel wall thickening. Normal appendix. Mild sigmoid diverticulosis, with no large bowel wall thickening or significant pericolonic fat stranding.  Vascular/Lymphatic: Normal caliber abdominal aorta. Patent portal, splenic, hepatic and renal veins. No pathologically enlarged lymph nodes in the abdomen or pelvis.  Reproductive: Top-normal size prostate.  Other: No pneumoperitoneum, ascites or focal fluid collection. Moderate fat containing periumbilical hernia.  Musculoskeletal: No aggressive appearing focal osseous lesions. Mild lumbar spondylosis.  VASCULAR MEASUREMENTS PERTINENT TO TAVR:  AORTA:  Minimal Aortic Diameter-16.4 x 15.2 mm  Severity of Aortic Calcification-none  RIGHT PELVIS:  Right Common Iliac Artery -  Minimal Diameter-9.7 x 9.1 mm  Tortuosity-mild  Calcification-none  Right External Iliac Artery -  Minimal  Diameter-7.6 x 7.5 mm  Tortuosity-mild  Calcification-none  Right Common Femoral Artery -  Minimal Diameter-8.1 x 7.6 mm  Tortuosity-mild  Calcification-none  LEFT PELVIS:  Left Common Iliac Artery -  Minimal Diameter-10.8 x 9.2 mm  Tortuosity-mild  Calcification-none  Left External Iliac Artery -  Minimal Diameter-7.4 x 6.8 mm  Tortuosity-mild  Calcification-none  Left Common Femoral Artery -  Minimal Diameter-8.7 x 7.7 mm  Tortuosity-mild  Calcification-none  Review of the MIP images confirms the above findings.  IMPRESSION: 1. Vascular findings and measurements pertinent to potential TAVR procedure, as detailed. Note made of mild narrowing of the aortic arch between the takeoffs of the left  common carotid and left subclavian arteries. 2. Borderline mild cardiomegaly. 3. Diffuse bronchial wall thickening, nonspecific, as can be seen with reactive airways disease or chronic bronchitis. 4. Mosaic attenuation throughout both lungs, nonspecific, potentially due to mosaic perfusion from pulmonary vascular disease or air trapping from small airways disease. 5. Small to moderate hiatal hernia. 6. Mild sigmoid diverticulosis. 7. Moderate fat containing periumbilical hernia.   Electronically Signed   By: Ilona Sorrel M.D.   On: 12/17/2019 11:05    Impression:  Patient has bicuspid aortic valve with stage C severe asymptomatic primary aortic insufficiency.  I have personally reviewed the patient's recent transesophageal echocardiogram and cardiac gated CT angiogram of the heart.  Patient has Sievers type I bicuspid aortic valve with fusion of the left and right leaflets and a single raphae.  There is no significant calcification in the fused left right cusp is prolapsing.  The size of the 3 cusps are somewhat asymmetrical with the noncoronary cusp considerably larger than both the left and the right cusps.  Aortic valve and aortic root  is normal to relatively large in size and with the absence of any significant calcification anatomical characteristics appear favorable for an attempt at valve repair rather than replacement.  Left ventricular systolic function appears normal.  There is significant left ventricular chamber enlargement.  Left ventricular dimensions were not reported on TEE but on cardiac gated CT angiogram of the heart left ventricular end-diastolic diameter is notably measured greater than 6.8 cm.  Patient does not appear to have significant coronary artery disease and coronary calcium score was 0, although the scan was interpreted as not being sufficient for evaluation of plaque.  Patient does have mild narrowing of the distal aortic arch at the site of previous coarctation repair, but this appears relatively mild and there does not appear to be significant recurrent coarctation.  Options include continued medical therapy with close follow-up versus proceeding with elective aortic valve repair or replacement.  Based upon the functional anatomy of the valve I favor an attempt at repair which could potentially provide excellent long-term durable result without need for anticoagulation or concerned about risk of structural valve deterioration and failure.    Plan:  The patient and his parents (who listened in via telephone) were again counseled at length regarding treatment alternatives for management of severe aortic insufficiency including continued medical therapy versus proceeding with aortic valve repair or replacement in the near future.  The natural history of aortic insufficiency was reviewed, as was long term prognosis with medical therapy alone.  The relatively favorable anatomy for possible valve repair was discussed.  Alternative surgical approaches were discussed and all of their questions have been addressed.  The patient is interested in proceeding with elective valve repair but desires to wait until later in  the year for a variety of personal reasons.  Under the circumstances we will plan for him to return to our office for follow-up transthoracic echocardiogram in approximately 6 months.  He will call and return sooner should he start to develop symptoms of exertional shortness of breath or fatigue.  In the meanwhile we will review the patient's cardiac gated coronary CT angiogram to make sure that preoperative diagnostic cardiac catheterization is not required.    I spent in excess of 30 minutes during the conduct of this office consultation and >50% of this time involved direct face-to-face encounter with the patient for counseling and/or coordination of their care.    Valentina Gu. Roxy Manns, MD 12/28/2019  2:25 PM

## 2019-12-28 NOTE — Patient Instructions (Signed)
Continue all previous medications without any changes at this time  Make every effort to stay physically active, get some type of exercise on a regular basis, and stick to a "heart healthy diet".  The long term benefits for regular exercise and a healthy diet are critically important to your overall health and wellbeing.   You have been advised of the numerous benefits associated with regular exercise and weight loss.  The long term benefits for your overall health and wellbeing cannot be overestimated.    

## 2020-01-07 ENCOUNTER — Ambulatory Visit (INDEPENDENT_AMBULATORY_CARE_PROVIDER_SITE_OTHER): Payer: 59 | Admitting: Cardiology

## 2020-01-07 ENCOUNTER — Other Ambulatory Visit: Payer: Self-pay

## 2020-01-07 ENCOUNTER — Encounter: Payer: Self-pay | Admitting: Cardiology

## 2020-01-07 VITALS — BP 158/78 | HR 86 | Ht 67.0 in | Wt 234.8 lb

## 2020-01-07 DIAGNOSIS — I351 Nonrheumatic aortic (valve) insufficiency: Secondary | ICD-10-CM

## 2020-01-07 DIAGNOSIS — Q231 Congenital insufficiency of aortic valve: Secondary | ICD-10-CM | POA: Diagnosis not present

## 2020-01-07 DIAGNOSIS — I10 Essential (primary) hypertension: Secondary | ICD-10-CM

## 2020-01-07 DIAGNOSIS — E669 Obesity, unspecified: Secondary | ICD-10-CM

## 2020-01-07 DIAGNOSIS — E782 Mixed hyperlipidemia: Secondary | ICD-10-CM

## 2020-01-07 DIAGNOSIS — Z8774 Personal history of (corrected) congenital malformations of heart and circulatory system: Secondary | ICD-10-CM

## 2020-01-07 NOTE — Patient Instructions (Signed)

## 2020-01-07 NOTE — Progress Notes (Signed)
Cardiology Office Note:    Date:  01/07/2020   ID:  Dean Molina, DOB Dec 08, 1975, MRN [id number]  PCP:  Tally Joe, MD  Cardiologist:  Thomasene Ripple, DO  Electrophysiologist:  None   Referring MD: Tally Joe, MD   Follow up visit   History of Present Illness:    Dean Molina is a 44 y.o. male with a hx of hypertension, bicuspid aortic valve, status post coarctation repair when the patient was 18 months, congenital pulmonic stenosis status post balloon valvuloplasty at age 89, obesity and hyperlipidemia.  Initially saw the patient on 05/25/2019 at that time he presented to be evaluated for abnormal echocardiogram which showed severe aortic regurgitation.  At the conclusion of his visit I recommended patient to CT surgery for evaluation.  In the interim he has followed up with CT surgery and is being worked up for his upcoming procedure.  The patient opted to put his procedure back up to October due to personal reasons.  He is here today for follow-up visit he offers no complaints at this time.  He is looking forward to his procedure and has started attempting to lose weight by dieting and increasing his exercise.  Past Medical History:  Diagnosis Date  . Acid reflux   . Aortic insufficiency due to bicuspid aortic valve   . Bicuspid aortic valve   . Coarctation of the aorta, s/p repair   . Essential hypertension 10/12/2019  . GERD (gastroesophageal reflux disease)   . Hyperlipidemia   . Hypertension   . Mixed hyperlipidemia 10/12/2019  . Nonrheumatic aortic valve insufficiency 10/12/2019  . Obesity (BMI 30-39.9)   . pulmonic valve stenosis s/p balloon valvuloplasty   . S/P repair of coarctation of aorta 10/12/2019    Past Surgical History:  Procedure Laterality Date  . CARDIAC CATHETERIZATION  1988  . PERCUTANEOUS BALLOON VALVULOPLASTY     pulmonic valve  . status repair of coarctation of the aorta    . SUPRAVALVULAR AORTIC STENOSIS REPAIR  1977  . TEE WITHOUT  CARDIOVERSION N/A 12/23/2019   Procedure: TRANSESOPHAGEAL ECHOCARDIOGRAM (TEE);  Surgeon: Thurmon Fair, MD;  Location: [facility] ENDOSCOPY;  Service: Cardiovascular;  Laterality: N/A;    Current Medications: Current Meds  Medication Sig  . GARLIC PO Take 1 tablet by mouth daily.   Marland Kitchen lovastatin (MEVACOR) 40 MG tablet Take 40 mg by mouth daily.   . Misc Natural Products (GINSENG COMPLEX PO) Take 1 tablet by mouth daily.   . Multiple Vitamins-Minerals (MULTIVITAMIN WITH MINERALS) tablet Take 1 tablet by mouth daily. With Zinc  . omeprazole (PRILOSEC) 20 MG capsule Take 20 mg by mouth daily.   Marland Kitchen OVER THE COUNTER MEDICATION Take 1 tablet by mouth daily. [place]oli apple cider vinegar  . valsartan-hydrochlorothiazide (DIOVAN-HCT) 160-25 MG tablet Take 1 tablet by mouth daily.   . Zinc 50 MG CAPS Take 50 mg by mouth daily.      Allergies:   Tamiflu [oseltamivir phosphate]   Social History   Socioeconomic History  . Marital status: Legally Separated    Spouse name: Not on file  . Number of children: Not on file  . Years of education: Not on file  . Highest education level: Not on file  Occupational History  . Not on file  Tobacco Use  . Smoking status: Former Smoker    Quit date: 10/11/2009    Years since quitting: 10.2  . Smokeless tobacco: Never Used  Substance and Sexual Activity  . Alcohol use: Not Currently  Comment: Rarely  . Drug use: Never  . Sexual activity: Not on file  Other Topics Concern  . Not on file  Social History Narrative  . Not on file   Social Determinants of Health   Financial Resource Strain:   . Difficulty of Paying Living Expenses: Not on file  Food Insecurity:   . Worried About Programme researcher, broadcasting/film/video in the Last Year: Not on file  . Ran Out of Food in the Last Year: Not on file  Transportation Needs:   . Lack of Transportation (Medical): Not on file  . Lack of Transportation (Non-Medical): Not on file  Physical Activity:   . Days of Exercise per Week: Not  on file  . Minutes of Exercise per Session: Not on file  Stress:   . Feeling of Stress : Not on file  Social Connections:   . Frequency of Communication with Friends and Family: Not on file  . Frequency of Social Gatherings with Friends and Family: Not on file  . Attends Religious Services: Not on file  . Active Member of Clubs or Organizations: Not on file  . Attends Banker Meetings: Not on file  . Marital Status: Not on file     Family History: The patient's family history includes Arthritis in his mother; Diabetes in his father; Hypertension in his father and mother.  ROS:   Review of Systems  Constitution: Negative for decreased appetite, fever and weight gain.  HENT: Negative for congestion, ear discharge, hoarse voice and sore throat.   Eyes: Negative for discharge, redness, vision loss in right eye and visual halos.  Cardiovascular: Negative for chest pain, dyspnea on exertion, leg swelling, orthopnea and palpitations.  Respiratory: Negative for cough, hemoptysis, shortness of breath and snoring.   Endocrine: Negative for heat intolerance and polyphagia.  Hematologic/Lymphatic: Negative for bleeding problem. Does not bruise/bleed easily.  Skin: Negative for flushing, nail changes, rash and suspicious lesions.  Musculoskeletal: Negative for arthritis, joint pain, muscle cramps, myalgias, neck pain and stiffness.  Gastrointestinal: Negative for abdominal pain, bowel incontinence, diarrhea and excessive appetite.  Genitourinary: Negative for decreased libido, genital sores and incomplete emptying.  Neurological: Negative for brief paralysis, focal weakness, headaches and loss of balance.  Psychiatric/Behavioral: Negative for altered mental status, depression and suicidal ideas.  Allergic/Immunologic: Negative for HIV exposure and persistent infections.    EKGs/Labs/Other Studies Reviewed:    The following studies were reviewed today:   EKG: None today  CCTA   IMPRESSION: 1. Bicuspid aortic valve with severe prolapse of the fused RCC/LCC.  2. Narrowing (18 mm 17 mm) noted in the distal aortic arch at the left subclavian take-off concerning for possible re-coarctation in this patient with reported prior coarctation repair (echo gradient noted to be ~21 mmHG).  3. Annular measurements support a 29 mm Edwards Sapient 3, but the left main coronary height is borderline (8 mm). Optimal deployment angle above.  4. Coronary calcium score of 0. The study was performed without use of NTG and is insufficient for plaque evaluation.     TEE Impression her 2/17 2021 1. Left ventricular ejection fraction, by estimation, is 60 to 65%. The  left ventricle has normal function. The left ventricle has no regional  wall motion abnormalities. The left ventricular internal cavity size was  mildly dilated. Left ventricular  diastolic function could not be evaluated.  2. Right ventricular systolic function is normal. The right ventricular  size is normal.  3. No left atrial/left atrial  appendage thrombus was detected.  4. The mitral valve is normal in structure and function. Mild mitral  valve regurgitation.  5. The aortic valve is bicuspid. Aortic valve regurgitation is severe.  Mild aortic valve sclerosis is present, with no evidence of aortic valve  stenosis.  6. The aortic annulus measures 2.5 cm. The aortic root (3.5 cm) and  proximal ascending aorta (2.9 cm) are normal in caliber. The mid-arch is  relatively smaller in caliber at 1.7 cm. The distal aortic arch is ectatic  at the site of previous coarctation  repair and left subclavian takeoff with a maximum diameter of 3.4 cm,  while the descending aorta is relatively small at 1.9 cm. While no true  coarctation is seen, there is a 50% "step-down" in aortic caliber. No  atheroma is seen. Aortic dilatation noted.   TTE IMPRESSIONS September 24, 2019 1. Left ventricular ejection fraction, by  visual estimation, is 55 to  60%. The left ventricle has normal function. There is no left ventricular  hypertrophy.  2. Left ventricular diastolic function could not be evaluated.  3. Moderately dilated left ventricular internal cavity size.  4. Global right ventricle has normal systolic function.The right  ventricular size is normal.  5. Left atrial size was normal.  6. Right atrial size was normal.  7. The mitral valve is normal in structure. Trace mitral valve  regurgitation. No evidence of mitral stenosis.  8. The tricuspid valve is normal in structure. Tricuspid valve  regurgitation is trivial.  9. Aortic valve regurgitation is severe.  10. The aortic valve is bicuspid. Aortic valve regurgitation is severe. No  evidence of aortic valve sclerosis or stenosis.  11. The pulmonic valve was normal in structure. Pulmonic valve  regurgitation is trivial.  12. Aortic dilatation noted.  13. There is mild dilatation of the aortic root measuring 41 mm.  14. The inferior vena cava is normal in size with greater than 50%  respiratory variability, suggesting right atrial pressure of 3 mmHg.  15. Normal LV function; moderate LVE; mildly dilated aortic root; probable  bicuspid aortic valve with severe eccentric AI; elevated velocity (2.3  m/s) in descending aorta; suggest CTA or MRA to R/O coarctation.   Recent Labs: No results found for requested labs within last 8760 hours.  Recent Lipid Panel No results found for: CHOL, TRIG, HDL, CHOLHDL, VLDL, LDLCALC, LDLDIRECT  Physical Exam:    VS:  BP (!) 158/78   Pulse 86   Ht 5\' 7"  (1.702 m)   Wt 234 lb 12.8 oz (106.5 kg)   SpO2 94%   BMI 36.77 kg/m     Wt Readings from Last 3 Encounters:  01/07/20 234 lb 12.8 oz (106.5 kg)  12/28/19 239 lb (108.4 kg)  12/23/19 230 lb (104.3 kg)     GEN: Well nourished, well developed in no acute distress HEENT: Normal NECK: No JVD; No carotid bruits LYMPHATICS: No lymphadenopathy CARDIAC:  S1S2 noted,RRR, no murmurs, rubs, gallops RESPIRATORY:  Clear to auscultation without rales, wheezing or rhonchi  ABDOMEN: Soft, non-tender, non-distended, +bowel sounds, no guarding. EXTREMITIES: No edema, No cyanosis, no clubbing MUSCULOSKELETAL:  No deformity  SKIN: Warm and dry NEUROLOGIC:  Alert and oriented x 3, non-focal PSYCHIATRIC:  Normal affect, good insight  ASSESSMENT:    1. Nonrheumatic aortic valve insufficiency   2. Bicuspid aortic valve   3. Essential hypertension   4. S/P repair of coarctation of aorta   5. Mixed hyperlipidemia   6. Obesity (BMI 30-39.9)  PLAN:     1.  Severe aortic regurgitation having is pending CT surgery.  He has been following up with Dr. Barry Dienes and initially had planned to which his surgery back to October 2021.  But he tells me he may want to have this done within the next 2 months.  I have advised him to call Dr. Barry Dienes office with this change of information.  2.  He is hypertensive in the office today but he tells me that at home his blood pressure is running between 120s to 130s.  He is very apprehensive on starting a new antihypertensive knowing his blood pressure at home.  Whitecoat syndrome may be a factor as well here.  Of asked him to bring his recorded/documented blood pressure readings from home.  In addition at his next visit to please bring his blood pressure monitor so we can be able to verify this.  3.  Hyperlipidemia continue patient his current lovastatin dose.  4.  Obesity-the patient understands the need to lose weight with diet and exercise. We have discussed specific strategies for this.  The patient is in agreement with the above plan. The patient left the office in stable condition.  The patient will follow up in 3 months or sooner if needed.   Medication Adjustments/Labs and Tests Ordered: Current medicines are reviewed at length with the patient today.  Concerns regarding medicines are outlined above.  No orders of  the defined types were placed in this encounter.  No orders of the defined types were placed in this encounter.   Patient Instructions  Medication Instructions:  Your physician recommends that you continue on your current medications as directed. Please refer to the Current Medication list given to you today.  *If you need a refill on your cardiac medications before your next appointment, please call your pharmacy*   Lab Work: None If you have labs (blood work) drawn today and your tests are completely normal, you will receive your results only by: Marland Kitchen MyChart Message (if you have MyChart) OR . A paper copy in the mail If you have any lab test that is abnormal or we need to change your treatment, we will call you to review the results.   Testing/Procedures: None   Follow-Up: At [facility], you and your health needs are our priority.  As part of our continuing mission to provide you with exceptional heart care, we have created designated Provider Care Teams.  These Care Teams include your primary Cardiologist (physician) and Advanced Practice Providers (APPs -  Physician Assistants and Nurse Practitioners) who all work together to provide you with the care you need, when you need it.  We recommend signing up for the patient portal called "MyChart".  Sign up information is provided on this After Visit Summary.  MyChart is used to connect with patients for Virtual Visits (Telemedicine).  Patients are able to view lab/test results, encounter notes, upcoming appointments, etc.  Non-urgent messages can be sent to your provider as well.   To learn more about what you can do with MyChart, go to [email or web address].    Your next appointment:   3 month(s)  The format for your next appointment:   In Person  Provider:   Thomasene Ripple, DO   Other Instructions      Adopting a Healthy Lifestyle.  Know what a healthy weight is for you (roughly BMI <25) and aim to maintain this     Aim for 7+ servings of fruits and vegetables  daily   65-80+ fluid ounces of water or unsweet tea for healthy kidneys   Limit to max 1 drink of alcohol per day; avoid smoking/tobacco   Limit animal fats in diet for cholesterol and heart health - choose grass fed whenever available   Avoid highly processed foods, and foods high in saturated/trans fats   Aim for low stress - take time to unwind and care for your mental health   Aim for 150 min of moderate intensity exercise weekly for heart health, and weights twice weekly for bone health   Aim for 7-9 hours of sleep daily   When it comes to diets, agreement about the perfect plan isnt easy to find, even among the experts. Experts at the [employer] developed an idea known as the Healthy Eating Plate. Just imagine a plate divided into logical, healthy portions.   The emphasis is on diet quality:   Load up on vegetables and fruits - one-half of your plate: Aim for color and variety, and remember that potatoes dont count.   Go for whole grains - one-quarter of your plate: Whole wheat, barley, wheat berries, quinoa, oats, brown rice, and foods made with them. If you want pasta, go with whole wheat pasta.   Protein power - one-quarter of your plate: Fish, chicken, beans, and nuts are all healthy, versatile protein sources. Limit red meat.   The diet, however, does go beyond the plate, offering a few other suggestions.   Use healthy plant oils, such as olive, canola, soy, corn, sunflower and peanut. Check the labels, and avoid partially hydrogenated oil, which have unhealthy trans fats.   If youre thirsty, drink water. Coffee and tea are good in moderation, but skip sugary drinks and limit milk and dairy products to one or two daily servings.   The type of carbohydrate in the diet is more important than the amount. Some sources of carbohydrates, such as vegetables, fruits, whole grains, and beans-are healthier than  others.   Finally, stay active  Signed, Berniece Salines, DO  01/07/2020 11:51 AM    [place]

## 2020-01-20 ENCOUNTER — Other Ambulatory Visit: Payer: Self-pay | Admitting: Thoracic Surgery (Cardiothoracic Vascular Surgery)

## 2020-01-20 DIAGNOSIS — I350 Nonrheumatic aortic (valve) stenosis: Secondary | ICD-10-CM

## 2020-02-10 ENCOUNTER — Telehealth (HOSPITAL_COMMUNITY): Payer: Self-pay | Admitting: Emergency Medicine

## 2020-02-10 ENCOUNTER — Encounter (HOSPITAL_COMMUNITY): Payer: Self-pay

## 2020-02-10 MED ORDER — METOPROLOL TARTRATE 100 MG PO TABS: 100.0000 mg | ORAL_TABLET | Freq: Once | ORAL | 0 refills | Status: DC

## 2020-02-10 NOTE — Telephone Encounter (Signed)
Reaching out to patient to offer assistance regarding upcoming cardiac imaging study; pt verbalizes understanding of appt date/time, parking situation and where to check in, pre-test NPO status and medications ordered, and verified current allergies; name and call back number provided for further questions should they arise Rockwell Alexandria RN Navigator Cardiac Imaging [facility] [phone] office [phone] cell   I explained to patient that for quality coronary images, a slower HR is best, therefore prescribing a one time dose of metoprolol tartrate 100mg  to be taken 2 hr prior to scan. Pt verbalized understanding and verified his preferred pharmacy Pt appreciated the call  

## 2020-02-11 ENCOUNTER — Ambulatory Visit (HOSPITAL_COMMUNITY)
Admission: RE | Admit: 2020-02-11 | Discharge: 2020-02-11 | Disposition: A | Discharge status: Home / Self Care | Payer: 59 | Source: Ambulatory Visit | Attending: Thoracic Surgery (Cardiothoracic Vascular Surgery) | Admitting: Thoracic Surgery (Cardiothoracic Vascular Surgery)

## 2020-02-11 ENCOUNTER — Other Ambulatory Visit: Payer: Self-pay

## 2020-02-11 DIAGNOSIS — I350 Nonrheumatic aortic (valve) stenosis: Secondary | ICD-10-CM | POA: Insufficient documentation

## 2020-02-11 MED ORDER — METOPROLOL TARTRATE 5 MG/5ML IV SOLN
INTRAVENOUS | Status: AC
  Filled 2020-02-11: qty 5

## 2020-02-11 MED ORDER — METOPROLOL TARTRATE 5 MG/5ML IV SOLN
5.0000 mg | INTRAVENOUS | Status: DC | PRN
  Administered 2020-02-11: 08:00:00 5 mg via INTRAVENOUS

## 2020-02-11 MED ORDER — NITROGLYCERIN 0.4 MG SL SUBL
SUBLINGUAL_TABLET | SUBLINGUAL | Status: AC
  Filled 2020-02-11: qty 2

## 2020-02-11 MED ORDER — NITROGLYCERIN 0.4 MG SL SUBL
0.8000 mg | SUBLINGUAL_TABLET | Freq: Once | SUBLINGUAL | Status: AC
  Administered 2020-02-11: 0.8 mg via SUBLINGUAL

## 2020-02-11 MED ORDER — IOHEXOL 350 MG/ML SOLN
80.0000 mL | Freq: Once | INTRAVENOUS | Status: AC | PRN
  Administered 2020-02-11: 08:00:00 80 mL via INTRAVENOUS

## 2020-03-11 ENCOUNTER — Other Ambulatory Visit: Payer: Self-pay

## 2020-03-11 ENCOUNTER — Encounter: Payer: Self-pay | Admitting: Thoracic Surgery (Cardiothoracic Vascular Surgery)

## 2020-04-22 ENCOUNTER — Telehealth: Payer: Self-pay | Admitting: Cardiology

## 2020-04-22 NOTE — Telephone Encounter (Signed)
He will need to call Dr. Orvan July office. He has already established with cardiothoracic surgery. Please provide him phone number if he does not have it.   Dean Sorrow, NP

## 2020-04-22 NOTE — Telephone Encounter (Signed)
Pt aware and had no additional questions.

## 2020-04-22 NOTE — Telephone Encounter (Signed)
New Message  Pt is wanting to schedule an aortic valve procedure he says he had discussed with Dr Servando Salina   Please advise

## 2020-05-11 ENCOUNTER — Ambulatory Visit (INDEPENDENT_AMBULATORY_CARE_PROVIDER_SITE_OTHER): Payer: 59 | Admitting: Cardiology

## 2020-05-11 ENCOUNTER — Other Ambulatory Visit: Payer: Self-pay

## 2020-05-11 ENCOUNTER — Encounter: Payer: Self-pay | Admitting: Cardiology

## 2020-05-11 VITALS — BP 132/70 | HR 79 | Ht 67.0 in | Wt 238.2 lb

## 2020-05-11 DIAGNOSIS — Q231 Congenital insufficiency of aortic valve: Secondary | ICD-10-CM | POA: Diagnosis not present

## 2020-05-11 DIAGNOSIS — I351 Nonrheumatic aortic (valve) insufficiency: Secondary | ICD-10-CM | POA: Diagnosis not present

## 2020-05-11 DIAGNOSIS — E782 Mixed hyperlipidemia: Secondary | ICD-10-CM

## 2020-05-11 DIAGNOSIS — Z8774 Personal history of (corrected) congenital malformations of heart and circulatory system: Secondary | ICD-10-CM | POA: Diagnosis not present

## 2020-05-11 DIAGNOSIS — I10 Essential (primary) hypertension: Secondary | ICD-10-CM | POA: Diagnosis not present

## 2020-05-11 NOTE — Progress Notes (Signed)
Cardiology Office Note:    Date:  05/11/2020   ID:  Dean Molina, DOB 11/04/1976, MRN [id number]  PCP:  Dean Joe, MD  Cardiologist:  Dean Ripple, DO  Electrophysiologist:  None   Referring MD: Dean Joe, MD   " I am doing ok"  History of Present Illness:    Dean Molina is a 44 y.o. male with a hx of hypertension, bicuspid aortic valve, status post coarctation repair when the patient was 18 months, congenital pulmonic stenosis status post balloon valvuloplasty at age 37, obesity and hyperlipidemia.  Initially saw the patient on 05/25/2019 at that time he presented to be evaluated for abnormal echocardiogram which showed severe aortic regurgitation.  At the conclusion of his visit I recommended patient to CT surgery for evaluation.  In the interim he has followed up with CT surgery and is being worked up for his upcoming procedure.  The patient opted to put his procedure back up to October due to personal reasons.  I saw the patient on January 07, 2020, at that time he was still making a decision to move forward with his surgery.  He had planned for October 2021.  He is here for follow-up today.  He tells me that he is thinking strongly to pursue his surgery but whenever he calls he gets nervous to hang up the phone before he make an appointment.  He denies any shortness of breath, chest pain, nausea, vomiting.  He tells me that he is active at home and at work.   Past Medical History:  Diagnosis Date  . Acid reflux   . Aortic insufficiency due to bicuspid aortic valve   . Bicuspid aortic valve   . Coarctation of the aorta, s/p repair   . Essential hypertension 10/12/2019  . GERD (gastroesophageal reflux disease)   . Hyperlipidemia   . Hypertension   . Mixed hyperlipidemia 10/12/2019  . Nonrheumatic aortic valve insufficiency 10/12/2019  . Obesity (BMI 30-39.9)   . pulmonic valve stenosis s/p balloon valvuloplasty   . S/P repair of coarctation of aorta 10/12/2019     Past Surgical History:  Procedure Laterality Date  . CARDIAC CATHETERIZATION  1988  . PERCUTANEOUS BALLOON VALVULOPLASTY     pulmonic valve  . status repair of coarctation of the aorta    . SUPRAVALVULAR AORTIC STENOSIS REPAIR  1977  . TEE WITHOUT CARDIOVERSION N/A 12/23/2019   Procedure: TRANSESOPHAGEAL ECHOCARDIOGRAM (TEE);  Surgeon: Dean Fair, MD;  Location: [facility] ENDOSCOPY;  Service: Cardiovascular;  Laterality: N/A;    Current Medications: Current Meds  Medication Sig  . GARLIC PO Take 1 tablet by mouth daily.   Marland Kitchen lovastatin (MEVACOR) 40 MG tablet Take 40 mg by mouth daily.   . Misc Natural Products (GINSENG COMPLEX PO) Take 1 tablet by mouth daily.   . Multiple Vitamins-Minerals (MULTIVITAMIN WITH MINERALS) tablet Take 1 tablet by mouth daily. With Zinc  . omeprazole (PRILOSEC) 20 MG capsule Take 20 mg by mouth daily.   Marland Kitchen OVER THE COUNTER MEDICATION Take 1 tablet by mouth daily. [place]oli apple cider vinegar  . valsartan-hydrochlorothiazide (DIOVAN-HCT) 160-25 MG tablet Take 1 tablet by mouth daily.   . Zinc 50 MG CAPS Take 50 mg by mouth daily.      Allergies:   Tamiflu [oseltamivir phosphate]   Social History   Socioeconomic History  . Marital status: Legally Separated    Spouse name: Not on file  . Number of children: Not on file  . Years of education: Not on  file  . Highest education level: Not on file  Occupational History  . Not on file  Tobacco Use  . Smoking status: Former Smoker    Quit date: 10/11/2009    Years since quitting: 10.5  . Smokeless tobacco: Never Used  Substance and Sexual Activity  . Alcohol use: Not Currently    Comment: Rarely  . Drug use: Never  . Sexual activity: Not on file  Other Topics Concern  . Not on file  Social History Narrative  . Not on file   Social Determinants of Health   Financial Resource Strain:   . Difficulty of Paying Living Expenses:   Food Insecurity:   . Worried About Programme researcher, broadcasting/film/video in the Last Year:    . Barista in the Last Year:   Transportation Needs:   . Freight forwarder (Medical):   Marland Kitchen Lack of Transportation (Non-Medical):   Physical Activity:   . Days of Exercise per Week:   . Minutes of Exercise per Session:   Stress:   . Feeling of Stress :   Social Connections:   . Frequency of Communication with Friends and Family:   . Frequency of Social Gatherings with Friends and Family:   . Attends Religious Services:   . Active Member of Clubs or Organizations:   . Attends Banker Meetings:   Marland Kitchen Marital Status:      Family History: The patient's family history includes Arthritis in his mother; Diabetes in his father; Hypertension in his father and mother.  ROS:   Review of Systems  Constitution: Negative for decreased appetite, fever and weight gain.  HENT: Negative for congestion, ear discharge, hoarse voice and sore throat.   Eyes: Negative for discharge, redness, vision loss in right eye and visual halos.  Cardiovascular: Negative for chest pain, dyspnea on exertion, leg swelling, orthopnea and palpitations.  Respiratory: Negative for cough, hemoptysis, shortness of breath and snoring.   Endocrine: Negative for heat intolerance and polyphagia.  Hematologic/Lymphatic: Negative for bleeding problem. Does not bruise/bleed easily.  Skin: Negative for flushing, nail changes, rash and suspicious lesions.  Musculoskeletal: Negative for arthritis, joint pain, muscle cramps, myalgias, neck pain and stiffness.  Gastrointestinal: Negative for abdominal pain, bowel incontinence, diarrhea and excessive appetite.  Genitourinary: Negative for decreased libido, genital sores and incomplete emptying.  Neurological: Negative for brief paralysis, focal weakness, headaches and loss of balance.  Psychiatric/Behavioral: Negative for altered mental status, depression and suicidal ideas.  Allergic/Immunologic: Negative for HIV exposure and persistent infections.     EKGs/Labs/Other Studies Reviewed:    The following studies were reviewed today:   EKG: None today  TEE Impression her 2/17 2021 1. Left ventricular ejection fraction, by estimation, is 60 to 65%. The  left ventricle has normal function. The left ventricle has no regional  wall motion abnormalities. The left ventricular internal cavity size was  mildly dilated. Left ventricular  diastolic function could not be evaluated.  2. Right ventricular systolic function is normal. The right ventricular  size is normal.  3. No left atrial/left atrial appendage thrombus was detected.  4. The mitral valve is normal in structure and function. Mild mitral  valve regurgitation.  5. The aortic valve is bicuspid. Aortic valve regurgitation is severe.  Mild aortic valve sclerosis is present, with no evidence of aortic valve  stenosis.  6. The aortic annulus measures 2.5 cm. The aortic root (3.5 cm) and  proximal ascending aorta (2.9 cm) are normal in  caliber. The mid-arch is  relatively smaller in caliber at 1.7 cm. The distal aortic arch is ectatic  at the site of previous coarctation  repair and left subclavian takeoff with a maximum diameter of 3.4 cm,  while the descending aorta is relatively small at 1.9 cm. While no true  coarctation is seen, there is a 50% "step-down" in aortic caliber. No  atheroma is seen. Aortic dilatation noted.   TTE IMPRESSIONS September 24, 2019 1. Left ventricular ejection fraction, by visual estimation, is 55 to  60%. The left ventricle has normal function. There is no left ventricular  hypertrophy.  2. Left ventricular diastolic function could not be evaluated.  3. Moderately dilated left ventricular internal cavity size.  4. Global right ventricle has normal systolic function.The right  ventricular size is normal.  5. Left atrial size was normal.  6. Right atrial size was normal.  7. The mitral valve is normal in structure. Trace mitral valve   regurgitation. No evidence of mitral stenosis.  8. The tricuspid valve is normal in structure. Tricuspid valve  regurgitation is trivial.  9. Aortic valve regurgitation is severe.  10. The aortic valve is bicuspid. Aortic valve regurgitation is severe. No  evidence of aortic valve sclerosis or stenosis.  11. The pulmonic valve was normal in structure. Pulmonic valve  regurgitation is trivial.  12. Aortic dilatation noted.  13. There is mild dilatation of the aortic root measuring 41 mm.  14. The inferior vena cava is normal in size with greater than 50% respiratory variability, suggesting right atrial pressure of 3 mmHg.  15. Normal LV function; moderate LVE; mildly dilated aortic root; probable bicuspid aortic valve with severe eccentric AI; elevated velocity (2.3 m/s) in descending aorta; suggest CTA or MRA to R/O coarctation.    Recent Labs: No results found for requested labs within last 8760 hours.  Recent Lipid Panel No results found for: CHOL, TRIG, HDL, CHOLHDL, VLDL, LDLCALC, LDLDIRECT  Physical Exam:    VS:  BP 132/70   Pulse 79   Ht  (1.702 m)   Wt 238 lb 3.2 oz (108 kg)   SpO2 97%   BMI 37.31 kg/m     Wt Readings from Last 3 Encounters:  05/11/20 238 lb 3.2 oz (108 kg)  01/07/20 234 lb 12.8 oz (106.5 kg)  12/28/19 239 lb (108.4 kg)     GEN: Well nourished, well developed in no acute distress HEENT: Normal NECK: No JVD; No carotid bruits LYMPHATICS: No lymphadenopathy CARDIAC: S1S2 noted,RRR, no murmurs, rubs, gallops RESPIRATORY:  Clear to auscultation without rales, wheezing or rhonchi  ABDOMEN: Soft, non-tender, non-distended, +bowel sounds, no guarding. EXTREMITIES: No edema, No cyanosis, no clubbing MUSCULOSKELETAL:  No deformity  SKIN: Warm and dry NEUROLOGIC:  Alert and oriented x 3, non-focal PSYCHIATRIC:  Normal affect, good insight  ASSESSMENT:    1. Nonrheumatic aortic valve insufficiency   2. Bicuspid aortic valve   3. Essential  hypertension   4. S/P repair of coarctation of aorta   5. Mixed hyperlipidemia    PLAN:     1.  He still is apprehensive about moving forward with his valve surgery.  I did offer the patient answer any questions that he had during his visit.  He had several questions about the procedure, why we are recommending the procedure as well as what is the benefits.  Advised the patient that it would be beneficial to get his surgery sooner than later.  I was able to  answer all of these questions to his satisfaction during his visit.  He plans to move forward with calling and setting up the follow-up appointment with Dr. Barry Dienes.   2.  His blood pressure is acceptable in the office today.  No changes was made to his antihypertensive regimen.  3.  Hyperlipidemia-continue patient on his current medication regimen with lovastatin 40 mg daily.  The patient is in agreement with the above plan. The patient left the office in stable condition.  The patient will follow up in 3 months or sooner if needed.   Medication Adjustments/Labs and Tests Ordered: Current medicines are reviewed at length with the patient today.  Concerns regarding medicines are outlined above.  No orders of the defined types were placed in this encounter.  No orders of the defined types were placed in this encounter.   Patient Instructions  Medication Instructions:  Your physician recommends that you continue on your current medications as directed. Please refer to the Current Medication list given to you today.  *If you need a refill on your cardiac medications before your next appointment, please call your pharmacy*   Lab Work: None If you have labs (blood work) drawn today and your tests are completely normal, you will receive your results only by: Marland Kitchen MyChart Message (if you have MyChart) OR . A paper copy in the mail If you have any lab test that is abnormal or we need to change your treatment, we will call you to review the  results.   Testing/Procedures: None   Follow-Up: At [facility], you and your health needs are our priority.  As part of our continuing mission to provide you with exceptional heart care, we have created designated Provider Care Teams.  These Care Teams include your primary Cardiologist (physician) and Advanced Practice Providers (APPs -  Physician Assistants and Nurse Practitioners) who all work together to provide you with the care you need, when you need it.  We recommend signing up for the patient portal called "MyChart".  Sign up information is provided on this After Visit Summary.  MyChart is used to connect with patients for Virtual Visits (Telemedicine).  Patients are able to view lab/test results, encounter notes, upcoming appointments, etc.  Non-urgent messages can be sent to your provider as well.   To learn more about what you can do with MyChart, go to [email or web address].    Your next appointment:   3 month(s)  The format for your next appointment:   In Person  Provider:   Thomasene Ripple, DO   Other Instructions      Adopting a Healthy Lifestyle.  Know what a healthy weight is for you (roughly BMI <25) and aim to maintain this   Aim for 7+ servings of fruits and vegetables daily   65-80+ fluid ounces of water or unsweet tea for healthy kidneys   Limit to max 1 drink of alcohol per day; avoid smoking/tobacco   Limit animal fats in diet for cholesterol and heart health - choose grass fed whenever available   Avoid highly processed foods, and foods high in saturated/trans fats   Aim for low stress - take time to unwind and care for your mental health   Aim for 150 min of moderate intensity exercise weekly for heart health, and weights twice weekly for bone health   Aim for 7-9 hours of sleep daily   When it comes to diets, agreement about the perfect plan isnt easy to find, even among the experts.  Experts at the [employer]  developed an idea known as the Healthy Eating Plate. Just imagine a plate divided into logical, healthy portions.   The emphasis is on diet quality:   Load up on vegetables and fruits - one-half of your plate: Aim for color and variety, and remember that potatoes dont count.   Go for whole grains - one-quarter of your plate: Whole wheat, barley, wheat berries, quinoa, oats, brown rice, and foods made with them. If you want pasta, go with whole wheat pasta.   Protein power - one-quarter of your plate: Fish, chicken, beans, and nuts are all healthy, versatile protein sources. Limit red meat.   The diet, however, does go beyond the plate, offering a few other suggestions.   Use healthy plant oils, such as olive, canola, soy, corn, sunflower and peanut. Check the labels, and avoid partially hydrogenated oil, which have unhealthy trans fats.   If youre thirsty, drink water. Coffee and tea are good in moderation, but skip sugary drinks and limit milk and dairy products to one or two daily servings.   The type of carbohydrate in the diet is more important than the amount. Some sources of carbohydrates, such as vegetables, fruits, whole grains, and beans-are healthier than others.   Finally, stay active  Signed, Dean RippleKardie Boby Eyer, DO  05/11/2020 2:24 PM    [facility]

## 2020-05-11 NOTE — Patient Instructions (Signed)

## 2020-05-12 ENCOUNTER — Other Ambulatory Visit: Payer: Self-pay | Admitting: Thoracic Surgery (Cardiothoracic Vascular Surgery)

## 2020-05-12 DIAGNOSIS — I350 Nonrheumatic aortic (valve) stenosis: Secondary | ICD-10-CM

## 2020-05-31 ENCOUNTER — Ambulatory Visit (HOSPITAL_COMMUNITY): Payer: 59 | Attending: Cardiology

## 2020-05-31 ENCOUNTER — Other Ambulatory Visit: Payer: Self-pay

## 2020-05-31 DIAGNOSIS — I350 Nonrheumatic aortic (valve) stenosis: Secondary | ICD-10-CM | POA: Insufficient documentation

## 2020-05-31 LAB — ECHOCARDIOGRAM COMPLETE
AR max vel: 2.49 cm2
AV Area VTI: 2.69 cm2
AV Area mean vel: 2.56 cm2
AV Mean grad: 6.4 mmHg
AV Peak grad: 10.8 mmHg
Ao pk vel: 1.64 m/s
Area-P 1/2: 3.37 cm2
P 1/2 time: 254 msec
S' Lateral: 4.9 cm

## 2020-05-31 MED ORDER — PERFLUTREN LIPID MICROSPHERE
1.0000 mL | INTRAVENOUS | Status: AC | PRN
  Administered 2020-05-31: 2 mL via INTRAVENOUS

## 2020-06-13 ENCOUNTER — Ambulatory Visit: Payer: 59 | Admitting: Thoracic Surgery (Cardiothoracic Vascular Surgery)

## 2020-06-13 ENCOUNTER — Encounter: Payer: Self-pay | Admitting: Thoracic Surgery (Cardiothoracic Vascular Surgery)

## 2020-06-13 ENCOUNTER — Other Ambulatory Visit: Payer: Self-pay

## 2020-06-13 VITALS — BP 145/77 | HR 86 | Temp 97.6°F | Resp 20 | Ht 67.0 in | Wt 235.0 lb

## 2020-06-13 DIAGNOSIS — Q231 Congenital insufficiency of aortic valve: Secondary | ICD-10-CM

## 2020-06-13 DIAGNOSIS — I350 Nonrheumatic aortic (valve) stenosis: Secondary | ICD-10-CM

## 2020-06-13 DIAGNOSIS — I351 Nonrheumatic aortic (valve) insufficiency: Secondary | ICD-10-CM | POA: Diagnosis not present

## 2020-06-13 NOTE — Patient Instructions (Signed)
   Continue taking all current medications without change through the day before surgery.  Make sure to bring all of your medications with you when you come for your Pre-Admission Testing appointment at [facility] Short-Stay Department.  Have nothing to eat or drink after midnight the night before surgery.  On the morning of surgery take only Prilosec with a sip of water.  At your appointment for Pre-Admission Testing at the [facility] Short-Stay Department you will be asked to sign permission forms for your upcoming surgery.  By definition your signature on these forms implies that you and/or your designee provide full informed consent for your planned surgical procedure(s), that alternative treatment options have been discussed, that you understand and accept any and all potential risks, and that you have some understanding of what to expect for your post-operative convalescence.  For any major cardiac surgical procedure potential operative risks include but are not limited to at least some risk of death, stroke or other neurologic complication, myocardial infarction, congestive heart failure, respiratory failure, renal failure, bleeding requiring blood transfusion and/or reexploration, irregular heart rhythm, heart block or bradycardia requiring permanent pacemaker, pneumonia, pericardial effusion, pleural effusion, wound infection, pulmonary embolus or other thromboembolic complication, chronic pain, or other complications related to the specific procedure(s) performed.  Please call to schedule a follow-up appointment in our office prior to surgery if you have any unresolved questions about your planned surgical procedure, the associated risks, alternative treatment options, and/or expectations for your post-operative recovery.

## 2020-06-13 NOTE — Progress Notes (Signed)
[address]       Dean Molina [address]             [phone]     CARDIOTHORACIC SURGERY OFFICE NOTE  Primary Cardiologist is Dean Ripple, DO PCP is Dean Joe, MD   HPI:  Patient is a 44 year old moderately obese male with history of congenital heart disease,hypertension, hyperlipidemia, and GE reflux disease who returns to the office today for further discuss treatment options for management of bicuspid aortic valve with severe primary aortic insufficiency.    He was initially seen in consultation on November 30, 2019 and more recently on December 28, 2019.  At that time the patient reported to be asymptomatic.  We discussed the rationale for elective surgical intervention as well as the alternative of continued watchful waiting with close medical follow-up.  The patient expressed considerable anxiety about the possibility of proceeding with surgery and requested follow-up appointment in 6 months.    Patient returns to the office today for follow-up and recently underwent routine follow-up echocardiogram which revealed severe aortic insufficiency with preserved left ventricular systolic function.  No significant changes in the left ventricular chamber size were reported.  The patient states that he is now interested in proceeding with elective surgery.  Although he still is doing well, he admits to decreased energy and a tendency to get short of breath with more strenuous exertion such as going up more than 1 flight of stairs.  He only gets short of breath with strenuous activity and overall he has not had to limit his activities.  He is still trying to exercise on a regular basis, and he states that he was recently able to walk 2 miles at a normal pace without difficulty.  He specifically denies any history of resting shortness of breath, PND, orthopnea, or lower extremity edema.  He has not had any chest pain or chest tightness either with activity or at  rest.   Current Outpatient Medications  Medication Sig Dispense Refill  . GARLIC PO Take 1 tablet by mouth daily.     Marland Kitchen lovastatin (MEVACOR) 40 MG tablet Take 40 mg by mouth daily.     . Misc Natural Products (GINSENG COMPLEX PO) Take 1 tablet by mouth daily.     . Multiple Vitamins-Minerals (MULTIVITAMIN WITH MINERALS) tablet Take 1 tablet by mouth daily. With Zinc    . omeprazole (PRILOSEC) 20 MG capsule Take 20 mg by mouth daily.     Marland Kitchen OVER THE COUNTER MEDICATION Take 1 tablet by mouth daily. Goli apple cider vinegar    . valsartan-hydrochlorothiazide (DIOVAN-HCT) 160-25 MG tablet Take 1 tablet by mouth daily.     . Zinc 50 MG CAPS Take 50 mg by mouth daily.      No current facility-administered medications for this visit.      Physical Exam:   BP (!) 145/77   Pulse 86   Temp 97.6 F (36.4 C) (Skin)   Resp 20   Ht 5\' 7"  (1.702 m)   Wt 235 lb (106.6 kg)   SpO2 94% Comment: RA  BMI 36.81 kg/m   General:  Moderately obese but well-appearing  Chest:   Clear to auscultation  CV:   Regular rate and rhythm with diastolic murmur  Incisions:  n/a  Abdomen:  Soft nontender  Extremities:  Warm and well-perfused  Diagnostic Tests:   ECHOCARDIOGRAM REPORT       Patient Name:  Dean Molina Date of Exam:  05/31/2020  Medical Rec #: [id number]   Height:    67.0 in  Accession #:  [id number]   Weight:    238.2 lb  Date of Birth: 1976/04/06   BSA:     2.178 m  Patient Age:  43 years    BP:      132/70 mmHg  Patient Gender: M       HR:      81 bpm.  Exam Location: [address]   Procedure: 2D Echo, Cardiac Doppler, Color Doppler and Intracardiac       Opacification Agent   Indications:  I35.0 Aortic Stenosis    History:    Patient has prior history of Echocardiogram examinations,  most         recent 09/24/2019. Coarctation repair-18 months; Risk         Factors:Obesity, Dyslipidemia and  Hypertension. Bicuspid  Aortic         Valve. Pulmonary valvuloplasty.    Sonographer:  Sedonia Small Rodgers-Jones RDCS  Referring Phys: [id number] Dean Molina Dean Molina     Sonographer Comments: Technically difficult study due to poor echo  windows.  IMPRESSIONS    1. Definity used; normal LV systolic function; grade 1 diastolic  dysfunction; mild LVE; mildly dilated aortic root; bicuspid aortic valve  with severe eccentic AI; elevated gradient in descending aorta noted on  previous echo not demonstrated on this  study.  2. Left ventricular ejection fraction, by estimation, is 55 to 60%. The  left ventricle has normal function. The left ventricle has no regional  wall motion abnormalities. The left ventricular internal cavity size was  mildly dilated. Left ventricular  diastolic parameters are consistent with Grade I diastolic dysfunction  (impaired relaxation). Elevated left atrial pressure.  3. Right ventricular systolic function is normal. The right ventricular  size is normal.  4. The mitral valve is normal in structure. Trivial mitral valve  regurgitation. No evidence of mitral stenosis.  5. The aortic valve is bicuspid. Aortic valve regurgitation is severe. No  aortic stenosis is present.  6. Aortic dilatation noted. There is mild dilatation of the aortic root  measuring 38 mm.  7. The inferior vena cava is normal in size with greater than 50%  respiratory variability, suggesting right atrial pressure of 3 mmHg.   FINDINGS  Left Ventricle: Left ventricular ejection fraction, by estimation, is 55  to 60%. The left ventricle has normal function. The left ventricle has no  regional wall motion abnormalities. Definity contrast agent was given IV  to delineate the left ventricular  endocardial borders. The left ventricular internal cavity size was mildly  dilated. There is no left ventricular hypertrophy. Left ventricular  diastolic parameters are consistent with  Grade I diastolic dysfunction  (impaired relaxation). Elevated left  atrial pressure.   Right Ventricle: The right ventricular size is normal.Right ventricular  systolic function is normal.   Left Atrium: Left atrial size was normal in size.   Right Atrium: Right atrial size was normal in size.   Pericardium: There is no evidence of pericardial effusion.   Mitral Valve: The mitral valve is normal in structure. Normal mobility of  the mitral valve leaflets. Trivial mitral valve regurgitation. No evidence  of mitral valve stenosis.   Tricuspid Valve: The tricuspid valve is normal in structure. Tricuspid  valve regurgitation is trivial. No evidence of tricuspid stenosis.   Aortic Valve: The aortic valve is bicuspid. Aortic valve regurgitation is  severe. Aortic regurgitation PHT measures 254 msec. No aortic  stenosis is  present. Aortic valve mean gradient measures 6.4 mmHg. Aortic valve peak  gradient measures 10.8 mmHg.  Aortic valve area, by VTI measures 2.69 cm.   Pulmonic Valve: The pulmonic valve was not well visualized. Pulmonic valve  regurgitation is not visualized. No evidence of pulmonic stenosis.   Aorta: Aortic dilatation noted. There is mild dilatation of the aortic  root measuring 38 mm.   Venous: The inferior vena cava is normal in size with greater than 50%  respiratory variability, suggesting right atrial pressure of 3 mmHg.   IAS/Shunts: No atrial level shunt detected by color flow Doppler.   Additional Comments: Definity used; normal LV systolic function; grade 1  diastolic dysfunction; mild LVE; mildly dilated aortic root; bicuspid  aortic valve with severe eccentic AI; elevated gradient in descending  aorta noted on previous echo not  demonstrated on this study.     LEFT VENTRICLE  PLAX 2D  LVIDd:     5.80 cm Diastology  LVIDs:     4.90 cm LV e' lateral:  7.18 cm/s  LV PW:     0.90 cm LV E/e' lateral: 16.3  LV IVS:    0.90 cm  LV e' medial:  6.20 cm/s  LVOT diam:   2.50 cm LV E/e' medial: 18.9  LV SV:     94  LV SV Index:  43  LVOT Area:   4.91 cm     RIGHT VENTRICLE  RV Basal diam: 3.00 cm  RV S prime:   12.40 cm/s  TAPSE (M-mode): 2.3 cm   LEFT ATRIUM       Index    RIGHT ATRIUM      Index  LA diam:    4.30 cm 1.97 cm/m RA Area:   11.30 cm  LA Vol (A2C):  76.8 ml 35.26 ml/m RA Volume:  25.00 ml 11.48 ml/m  LA Vol (A4C):  50.2 ml 23.05 ml/m  LA Biplane Vol: 67.4 ml 30.94 ml/m  AORTIC VALVE          PULMONIC VALVE  AV Area (Vmax):  2.49 cm   PV Vmax:    0.81 m/s  AV Area (Vmean):  2.56 cm   PV Vmean:   63.420 cm/s  AV Area (VTI):   2.69 cm   PV VTI:    0.150 m  AV Vmax:      164.00 cm/s PV Peak grad: 2.6 mmHg  AV Vmean:     121.800 cm/s PV Mean grad: 2.0 mmHg  AV VTI:      0.351 m  AV Peak Grad:   10.8 mmHg  AV Mean Grad:   6.4 mmHg  LVOT Vmax:     83.27 cm/s  LVOT Vmean:    63.500 cm/s  LVOT VTI:     0.192 m  LVOT/AV VTI ratio: 0.55  AI PHT:      254 msec    AORTA  Ao Root diam: 3.80 cm  Ao Asc diam: 3.70 cm   MITRAL VALVE  MV Area (PHT): 3.37 cm   SHUNTS  MV Decel Time: 225 msec   Systemic VTI: 0.19 m  MV E velocity: 117.00 cm/s Systemic Diam: 2.50 cm  MV A velocity: 125.00 cm/s  MV E/A ratio: 0.94   Olga MillersBrian Crenshaw MD  Electronically signed by Olga MillersBrian Crenshaw MD  Signature Date/Time: 05/31/2020/12:16:41 PM      Impression:  Patient has bicuspid aortic valve with earlly stage D severe symptomatic primary aortic insufficiency.  Although he  is doing well clinically he does admit to worsening fatigue and a tendency to get short of breath with more strenuous physical exertion, consistent with chronic diastolic congestive heart failure, New York Heart Association functional class I-II  I have personally reviewed the patient's recent transthoracic  echocardiogram and previous transesophageal echocardiogram and cardiac gated CT angiogram of the heart.  Patient has Sievers type I bicuspid aortic valve with fusion of the left and right leaflets and a single raphae.  There is no significant calcification in the fused left right cusp is prolapsing.  The size of the 3 cusps are somewhat asymmetrical with the noncoronary cusp considerably larger than both the left and the right cusps.  Aortic valve and aortic root is normal to relatively large in size and with the absence of any significant calcification anatomical characteristics appear favorable for an attempt at valve repair rather than replacement.  Left ventricular systolic function appears normal.  There is some left ventricular chamber enlargement.  Left ventricular dimensions were not reported on TEE but on cardiac gated CT angiogram of the heart left ventricular end-diastolic diameter is notably measured greater than 6.8 cm.  Patient does not appear to have significant coronary artery disease and coronary calcium score was 0, although the scan was interpreted as not being sufficient for evaluation of plaque.  Patient does have mild narrowing of the distal aortic arch at the site of previous coarctation repair, but this appears relatively mild and there does not appear to be significant recurrent coarctation.  Patient would best be treated with elective aortic valve repair or replacement.  He is now starting to experience some mild symptoms.  Aortic insufficiency is quite severe.  Left ventricular function appears preserved. Based upon the functional anatomy of the valve I favor an attempt at repair which could potentially provide excellent long-term durable result without need for anticoagulation or concerned about risk of structural valve deterioration and failure.    Plan:  The patientwas againcounseled at length regarding treatment alternatives for management of severe aortic  insufficiencyincluding continued medical therapy versus proceeding with aortic valve repair orreplacement in the near future. The natural history of aortic insufficiencywas reviewed, as was long term prognosis with medical therapy alone.  The relatively favorable anatomy for possible valve repair was discussed.    In the event that successful valve repair is not feasible, discussion was held comparing the relative risks of mechanical valve replacement with need for lifelong anticoagulation versus use of a bioprosthetic tissue valve and the associated potential for late structural valve deterioration and failure.  Given the patient's relatively young age and lack of contraindications to long-term anticoagulation, valve replacement was suggested as likely best alternative if valve repair is not feasible.  The patient desires to discuss this further with his family, as he has recollection of other family members doing poorly with long-term anticoagulation using warfarin.  We tentatively plan to proceed with surgery on July 27, 2020.  The patient will return to our office for follow-up prior to surgery on July 25, 2020.     I spent in excess of 30 minutes during the conduct of this office consultation and >50% of this time involved direct face-to-face encounter with the patient for counseling and/or coordination of their care.   Salvatore Decent. Cornelius Moras, MD 06/13/2020 3:07 PM

## 2020-06-21 ENCOUNTER — Encounter: Payer: Self-pay | Admitting: *Deleted

## 2020-06-21 ENCOUNTER — Other Ambulatory Visit: Payer: Self-pay | Admitting: *Deleted

## 2020-06-21 DIAGNOSIS — I351 Nonrheumatic aortic (valve) insufficiency: Secondary | ICD-10-CM

## 2020-06-27 ENCOUNTER — Ambulatory Visit: Payer: 59 | Admitting: Thoracic Surgery (Cardiothoracic Vascular Surgery)

## 2020-07-22 NOTE — Pre-Procedure Instructions (Signed)
Dean Molina  07/22/2020      [id number] [place], [place] - [address] [place] [address] Phone: [phone] Fax: [phone]    Your procedure is scheduled on Sept. 22  Report to [facility] Entrance A at 5:30 A.M.  Call this number if you have problems the morning of surgery:  [phone]   Remember:  Do not eat or drink after midnight.      Take these medicines the morning of surgery with A SIP OF WATER :               Lovastatin (mevacor)              Omeprazole (prilosec)                          7 days prior to surgery STOP taking any Aspirin (unless otherwise instructed by your surgeon), Aleve, Naproxen, Ibuprofen, Motrin, Advil, Goody's, BC's, all herbal medications, fish oil, and all vitamins.            Follow your surgeon's instructions on when to stop Aspirin.  If no instructions were given by your surgeon then you will need to call the office to get those instructions.      Do not wear jewelry.  Do not wear lotions, powders, or perfumes, or deodorant.  Do not shave 48 hours prior to surgery.  Men may shave face and neck.  Do not bring valuables to the hospital.  [facility] is not responsible for any belongings or valuables.  Contacts, dentures or bridgework may not be worn into surgery.  Leave your suitcase in the car.  After surgery it may be brought to your room.  For patients admitted to the hospital, discharge time will be determined by your treatment team.  Patients discharged the day of surgery will not be allowed to drive home.    Special instructions:   [facility]- Preparing For Surgery  Before surgery, you can play an important role. Because skin is not sterile, your skin needs to be as free of germs as possible. You can reduce the number of germs on your skin by washing with CHG (chlorahexidine gluconate) Soap before surgery.  CHG is an antiseptic cleaner which kills germs  and bonds with the skin to continue killing germs even after washing.    Oral Hygiene is also important to reduce your risk of infection.  Remember - BRUSH YOUR TEETH THE MORNING OF SURGERY WITH YOUR REGULAR TOOTHPASTE  Please do not use if you have an allergy to CHG or antibacterial soaps. If your skin becomes reddened/irritated stop using the CHG.  Do not shave (including legs and underarms) for at least 48 hours prior to first CHG shower. It is OK to shave your face.  Please follow these instructions carefully.   1. Shower the NIGHT BEFORE SURGERY and the MORNING OF SURGERY with CHG.   2. If you chose to wash your hair, wash your hair first as usual with your normal shampoo.  3. After you shampoo, rinse your hair and body thoroughly to remove the shampoo.  4. Use CHG as you would any other liquid soap. You can apply CHG directly to the skin and wash gently with a scrungie or a clean washcloth.   5. Apply the CHG Soap to your body ONLY FROM THE NECK DOWN.  Do not use on open  wounds or open sores. Avoid contact with your eyes, ears, mouth and genitals (private parts). Wash Face and genitals (private parts)  with your normal soap.  6. Wash thoroughly, paying special attention to the area where your surgery will be performed.  7. Thoroughly rinse your body with warm water from the neck down.  8. DO NOT shower/wash with your normal soap after using and rinsing off the CHG Soap.  9. Pat yourself dry with a CLEAN TOWEL.  10. Wear CLEAN PAJAMAS to bed the night before surgery, wear comfortable clothes the morning of surgery  11. Place CLEAN SHEETS on your bed the night of your first shower and DO NOT SLEEP WITH PETS.    Day of Surgery:  Do not apply any deodorants/lotions.  Please wear clean clothes to the hospital/surgery center.   Remember to brush your teeth WITH YOUR REGULAR TOOTHPASTE.    Please read over the following fact sheets that you were given.

## 2020-07-25 ENCOUNTER — Encounter (HOSPITAL_COMMUNITY)
Admission: RE | Admit: 2020-07-25 | Discharge: 2020-07-25 | Disposition: A | Discharge status: Home / Self Care | Payer: 59 | Source: Ambulatory Visit | Attending: Thoracic Surgery (Cardiothoracic Vascular Surgery) | Admitting: Thoracic Surgery (Cardiothoracic Vascular Surgery)

## 2020-07-25 ENCOUNTER — Encounter (HOSPITAL_COMMUNITY): Payer: Self-pay

## 2020-07-25 ENCOUNTER — Other Ambulatory Visit (HOSPITAL_COMMUNITY)
Admission: RE | Admit: 2020-07-25 | Discharge: 2020-07-25 | Disposition: A | Discharge status: Home / Self Care | Payer: 59 | Source: Ambulatory Visit | Attending: Thoracic Surgery (Cardiothoracic Vascular Surgery) | Admitting: Thoracic Surgery (Cardiothoracic Vascular Surgery)

## 2020-07-25 ENCOUNTER — Other Ambulatory Visit: Payer: Self-pay

## 2020-07-25 ENCOUNTER — Ambulatory Visit (INDEPENDENT_AMBULATORY_CARE_PROVIDER_SITE_OTHER): Payer: 59 | Admitting: Thoracic Surgery (Cardiothoracic Vascular Surgery)

## 2020-07-25 ENCOUNTER — Ambulatory Visit (HOSPITAL_BASED_OUTPATIENT_CLINIC_OR_DEPARTMENT_OTHER)
Admission: RE | Admit: 2020-07-25 | Discharge: 2020-07-25 | Disposition: A | Discharge status: Home / Self Care | Payer: 59 | Source: Ambulatory Visit | Attending: Thoracic Surgery (Cardiothoracic Vascular Surgery) | Admitting: Thoracic Surgery (Cardiothoracic Vascular Surgery)

## 2020-07-25 ENCOUNTER — Encounter: Payer: Self-pay | Admitting: Thoracic Surgery (Cardiothoracic Vascular Surgery)

## 2020-07-25 VITALS — BP 148/79 | HR 80 | Temp 97.7°F | Resp 20 | Ht 67.0 in | Wt 237.0 lb

## 2020-07-25 DIAGNOSIS — Z01818 Encounter for other preprocedural examination: Secondary | ICD-10-CM | POA: Insufficient documentation

## 2020-07-25 DIAGNOSIS — I351 Nonrheumatic aortic (valve) insufficiency: Secondary | ICD-10-CM

## 2020-07-25 DIAGNOSIS — Z87798 Personal history of other (corrected) congenital malformations: Secondary | ICD-10-CM | POA: Insufficient documentation

## 2020-07-25 DIAGNOSIS — E669 Obesity, unspecified: Secondary | ICD-10-CM | POA: Insufficient documentation

## 2020-07-25 DIAGNOSIS — Z20822 Contact with and (suspected) exposure to covid-19: Secondary | ICD-10-CM | POA: Insufficient documentation

## 2020-07-25 DIAGNOSIS — Z6837 Body mass index (BMI) 37.0-37.9, adult: Secondary | ICD-10-CM | POA: Insufficient documentation

## 2020-07-25 DIAGNOSIS — I358 Other nonrheumatic aortic valve disorders: Secondary | ICD-10-CM | POA: Insufficient documentation

## 2020-07-25 DIAGNOSIS — E785 Hyperlipidemia, unspecified: Secondary | ICD-10-CM | POA: Insufficient documentation

## 2020-07-25 DIAGNOSIS — Q231 Congenital insufficiency of aortic valve: Secondary | ICD-10-CM | POA: Insufficient documentation

## 2020-07-25 DIAGNOSIS — I350 Nonrheumatic aortic (valve) stenosis: Secondary | ICD-10-CM | POA: Diagnosis not present

## 2020-07-25 DIAGNOSIS — K219 Gastro-esophageal reflux disease without esophagitis: Secondary | ICD-10-CM | POA: Insufficient documentation

## 2020-07-25 DIAGNOSIS — Z79899 Other long term (current) drug therapy: Secondary | ICD-10-CM | POA: Insufficient documentation

## 2020-07-25 DIAGNOSIS — I10 Essential (primary) hypertension: Secondary | ICD-10-CM | POA: Insufficient documentation

## 2020-07-25 DIAGNOSIS — Q221 Congenital pulmonary valve stenosis: Secondary | ICD-10-CM | POA: Insufficient documentation

## 2020-07-25 DIAGNOSIS — Q251 Coarctation of aorta: Secondary | ICD-10-CM | POA: Insufficient documentation

## 2020-07-25 HISTORY — DX: Cardiac murmur, unspecified: R01.1

## 2020-07-25 LAB — SURGICAL PCR SCREEN
MRSA, PCR: NEGATIVE
Staphylococcus aureus: NEGATIVE

## 2020-07-25 LAB — COMPREHENSIVE METABOLIC PANEL
ALT: 53 U/L — ABNORMAL HIGH (ref 0–44)
AST: 36 U/L (ref 15–41)
Albumin: 3.6 g/dL (ref 3.5–5.0)
Alkaline Phosphatase: 61 U/L (ref 38–126)
Anion gap: 8 (ref 5–15)
BUN: 13 mg/dL (ref 6–20)
CO2: 30 mmol/L (ref 22–32)
Calcium: 8.9 mg/dL (ref 8.9–10.3)
Chloride: 103 mmol/L (ref 98–111)
Creatinine, Ser: 1.06 mg/dL (ref 0.61–1.24)
GFR calc Af Amer: >60 mL/min (ref >60)
GFR calc non Af Amer: >60 mL/min (ref >60)
Glucose, Bld: 100 mg/dL — ABNORMAL HIGH (ref 70–99)
Potassium: 3.8 mmol/L (ref 3.5–5.1)
Sodium: 141 mmol/L (ref 135–145)
Total Bilirubin: 0.8 mg/dL (ref 0.3–1.2)
Total Protein: 7.1 g/dL (ref 6.5–8.1)

## 2020-07-25 LAB — URINALYSIS, ROUTINE W REFLEX MICROSCOPIC
Bacteria, UA: NONE SEEN
Bilirubin Urine: NEGATIVE
Glucose, UA: NEGATIVE mg/dL
Ketones, ur: NEGATIVE mg/dL
Leukocytes,Ua: NEGATIVE
Nitrite: NEGATIVE
Protein, ur: 100 mg/dL — AB
Specific Gravity, Urine: 1.013 (ref 1.005–1.030)
pH: 5 (ref 5.0–8.0)

## 2020-07-25 LAB — CBC
HCT: 45 % (ref 39.0–52.0)
Hemoglobin: 14.9 g/dL (ref 13.0–17.0)
MCH: 29.9 pg (ref 26.0–34.0)
MCHC: 33.1 g/dL (ref 30.0–36.0)
MCV: 90.4 fL (ref 80.0–100.0)
Platelets: 257 10*3/uL (ref 150–400)
RBC: 4.98 MIL/uL (ref 4.22–5.81)
RDW: 12.2 % (ref 11.5–15.5)
WBC: 6.4 10*3/uL (ref 4.0–10.5)
nRBC: 0 % (ref 0.0–0.2)

## 2020-07-25 LAB — PROTIME-INR
INR: 1 (ref 0.8–1.2)
Prothrombin Time: 12.8 seconds (ref 11.4–15.2)

## 2020-07-25 LAB — TYPE AND SCREEN
ABO/RH(D): O NEG
Antibody Screen: NEGATIVE

## 2020-07-25 LAB — HEMOGLOBIN A1C
Hgb A1c MFr Bld: 7.2 % — ABNORMAL HIGH (ref 4.8–5.6)
Mean Plasma Glucose: 159.94 mg/dL

## 2020-07-25 LAB — APTT: aPTT: 30 seconds (ref 24–36)

## 2020-07-25 LAB — SARS CORONAVIRUS 2 (TAT 6-24 HRS): SARS Coronavirus 2: NEGATIVE

## 2020-07-25 NOTE — H&P (View-Only) (Signed)
     [address]       [place] [address]             [phone]     CARDIOTHORACIC SURGERY CONSULTATION REPORT  Referring Provider is Molina, Kardie, DO PCP is Molina, David, MD  Chief Complaint  Patient presents with  . Aortic Stenosis    further discuss surgery scheduled for 07/27/20    HPI:  Patient is a 44-year-old moderately obese male with history of congenital heart disease,hypertension, hyperlipidemia, and GE reflux disease whoreturns to the office today for further discuss treatment options for management of bicuspid aortic valve with severe primary aortic insufficiency.   He was initially seen in consultation on November 30, 2019 and shortly after that on December 28, 2019.  At that time the patient reported to be asymptomatic.  We discussed the rationale for elective surgical intervention as well as the alternative of continued watchful waiting with close medical follow-up.  The patient expressed considerable anxiety about the possibility of proceeding with surgery and requested follow-up appointment in 6 months.    Patient returns to the office on June 13, 2020 for follow-up and had recently undergone routine follow-up echocardiogram which revealed severe aortic insufficiency with preserved left ventricular systolic function.  No significant changes in the left ventricular chamber size were reported.  The patient states that he is now interested in proceeding with elective surgery.  Although he still is doing well, he admits to decreased energy and a tendency to get short of breath with more strenuous exertion such as going up more than 1 flight of stairs.  He only gets short of breath with strenuous activity and overall he has not had to limit his activities.  He is still trying to exercise on a regular basis, and he states that he was recently able to walk 2 miles at a normal pace without difficulty.  He specifically denies any history of resting shortness  of breath, PND, orthopnea, or lower extremity edema.  He has not had any chest pain or chest tightness either with activity or at rest.  At that time we made tentative plans for elective aortic valve repair on July 27, 2020.  Patient returns to the office today with his mother present for follow-up consultation prior to surgery.  He reports no new problems or complaints.  He specifically denies any problems with shortness of breath either with activity or at rest.  He has not had any chest pain or chest tightness.  He has not had fevers, chills, nor productive cough.  He has not been exposed to any persons with known or suspected COVID-19 infection.  He has been fully vaccinated against the COVID-19 virus.   Past Medical History:  Diagnosis Date  . Acid reflux   . Aortic insufficiency due to bicuspid aortic valve   . Bicuspid aortic valve   . Coarctation of the aorta, s/p repair   . Essential hypertension 10/12/2019  . GERD (gastroesophageal reflux disease)   . Hyperlipidemia   . Hypertension   . Mixed hyperlipidemia 10/12/2019  . Nonrheumatic aortic valve insufficiency 10/12/2019  . Obesity (BMI 30-39.9)   . pulmonic valve stenosis s/p balloon valvuloplasty   . S/P repair of coarctation of aorta 10/12/2019    Past Surgical History:  Procedure Laterality Date  . CARDIAC CATHETERIZATION  1988  . PERCUTANEOUS BALLOON VALVULOPLASTY     pulmonic valve  . status repair of coarctation of the aorta    .   SUPRAVALVULAR AORTIC STENOSIS REPAIR  1977  . TEE WITHOUT CARDIOVERSION N/A 12/23/2019   Procedure: TRANSESOPHAGEAL ECHOCARDIOGRAM (TEE);  Surgeon: Molina, Mihai, MD;  Location: [facility];  Service: Cardiovascular;  Laterality: N/A;    Family History  Problem Relation Age of Onset  . Hypertension Mother   . Arthritis Mother   . Diabetes Father   . Hypertension Father     Social History   Socioeconomic History  . Marital status: Legally Separated    Spouse name: Not on file    . Number of children: Not on file  . Years of education: Not on file  . Highest education level: Not on file  Occupational History  . Not on file  Tobacco Use  . Smoking status: Former Smoker    Quit date: 10/11/2009    Years since quitting: 10.7  . Smokeless tobacco: Never Used  Substance and Sexual Activity  . Alcohol use: Not Currently    Comment: Rarely  . Drug use: Never  . Sexual activity: Not on file  Other Topics Concern  . Not on file  Social History Narrative  . Not on file   Social Determinants of Health   Financial Resource Strain:   . Difficulty of Paying Living Expenses: Not on file  Food Insecurity:   . Worried About Running Out of Food in the Last Year: Not on file  . Ran Out of Food in the Last Year: Not on file  Transportation Needs:   . Lack of Transportation (Medical): Not on file  . Lack of Transportation (Non-Medical): Not on file  Physical Activity:   . Days of Exercise per Week: Not on file  . Minutes of Exercise per Session: Not on file  Stress:   . Feeling of Stress : Not on file  Social Connections:   . Frequency of Communication with Friends and Family: Not on file  . Frequency of Social Gatherings with Friends and Family: Not on file  . Attends Religious Services: Not on file  . Active Member of Clubs or Organizations: Not on file  . Attends Club or Organization Meetings: Not on file  . Marital Status: Not on file  Intimate Partner Violence:   . Fear of Current or Ex-Partner: Not on file  . Emotionally Abused: Not on file  . Physically Abused: Not on file  . Sexually Abused: Not on file    Current Outpatient Medications  Medication Sig Dispense Refill  . GARLIC PO Take 1 tablet by mouth daily.     . lovastatin (MEVACOR) 40 MG tablet Take 40 mg by mouth daily.     . Misc Natural Products (GINSENG COMPLEX PO) Take 1 tablet by mouth daily.     . Multiple Vitamins-Minerals (MULTIVITAMIN WITH MINERALS) tablet Take 1 tablet by mouth daily.      . omeprazole (PRILOSEC) 20 MG capsule Take 20 mg by mouth daily.     . OVER THE COUNTER MEDICATION Take 2 tablets by mouth in the morning and at bedtime. Goli apple cider vinegar     . valsartan-hydrochlorothiazide (DIOVAN-HCT) 160-25 MG tablet Take 1 tablet by mouth daily.     . Zinc 50 MG CAPS Take 50 mg by mouth daily.      No current facility-administered medications for this visit.    Allergies  Allergen Reactions  . Tamiflu [Oseltamivir Phosphate] Nausea And Vomiting      Review of Systems:   General:  normal appetite, decreased energy, no weight gain, no weight   loss, no fever  Cardiac:  no chest pain with exertion, no chest pain at rest, +SOB with more strenuous exertion, no resting SOB, no PND, no orthopnea, no palpitations, no arrhythmia, no atrial fibrillation, no LE edema, no dizzy spells, no syncope  Respiratory:  no shortness of breath, no home oxygen, no productive cough, no dry cough, no bronchitis, no wheezing, no hemoptysis, no asthma, no pain with inspiration or cough, no sleep apnea, no CPAP at night  GI:   no difficulty swallowing, + reflux, no frequent heartburn, no hiatal hernia, no abdominal pain, no constipation, no diarrhea, no hematochezia, no hematemesis, no melena  GU:   no dysuria,  no frequency, no urinary tract infection, no hematuria, no enlarged prostate, no kidney stones, no kidney disease  Vascular:  no pain suggestive of claudication, no pain in feet, no leg cramps, no varicose veins, no DVT, no non-healing foot ulcer  Neuro:   no stroke, no TIA's, no seizures, no headaches, no temporary blindness one eye,  no slurred speech, no peripheral neuropathy, no chronic pain, no instability of gait, no memory/cognitive dysfunction  Musculoskeletal: no arthritis, no joint swelling, no myalgias, no difficulty walking, normal mobility   Skin:   no rash, no itching, no skin infections, no pressure sores or ulcerations  Psych:   no anxiety, no depression, +  nervousness, no unusual recent stress  Eyes:   no blurry vision, no floaters, no recent vision changes, + wears glasses or contacts  ENT:   no hearing loss, no loose or painful teeth, no dentures, last saw dentist within the past 6 months  Hematologic:  no easy bruising, no abnormal bleeding, no clotting disorder, no frequent epistaxis  Endocrine:  no diabetes, does not check CBG's at home     Physical Exam:   BP (!) 148/79   Pulse 80   Temp 97.7 F (36.5 C) (Skin)   Resp 20   Ht 5' 7" (1.702 m)   Wt 237 lb (107.5 kg)   SpO2 96%   BMI 37.12 kg/m   General:  Moderately obese,  well-appearing  HEENT:  Unremarkable   Neck:   no JVD, no bruits, no adenopathy   Chest:   clear to auscultation, symmetrical breath sounds, no wheezes, no rhonchi   CV:   RRR, grade III/VI diastolic murmur   Abdomen:  soft, non-tender, no masses   Extremities:  warm, well-perfused, pulses palpable, no LE edema  Rectal/GU  Deferred  Neuro:   Grossly non-focal and symmetrical throughout  Skin:   Clean and dry, no rashes, no breakdown   Diagnostic Tests:   ECHOCARDIOGRAM REPORT       Patient Name:  Dean Molina Date of Exam: 05/31/2020  Medical Rec #: [id number]   Height:    67.0 in  Accession #:  [id number]   Weight:    238.2 lb  Date of Birth: 06/12/1976   BSA:     2.178 m  Patient Age:  43 years    BP:      132/70 mmHg  Patient Gender: M       HR:      81 bpm.  Exam Location: [address]   Procedure: 2D Echo, Cardiac Doppler, Color Doppler and Intracardiac       Opacification Agent   Indications:  I35.0 Aortic Stenosis    History:    Patient has prior history of Echocardiogram examinations,  most         recent 09/24/2019. Coarctation repair-18   months; Risk         Factors:Obesity, Dyslipidemia and Hypertension. Bicuspid  Aortic         Valve. Pulmonary valvuloplasty.    Sonographer:  NaTashia  Rodgers-Jones RDCS  Referring Phys: [id number] Mida Cory H Jakaylee Sasaki     Sonographer Comments: Technically difficult study due to poor echo  windows.  IMPRESSIONS    1. Definity used; normal LV systolic function; grade 1 diastolic  dysfunction; mild LVE; mildly dilated aortic root; bicuspid aortic valve  with severe eccentic AI; elevated gradient in descending aorta noted on  previous echo not demonstrated on this  study.  2. Left ventricular ejection fraction, by estimation, is 55 to 60%. The  left ventricle has normal function. The left ventricle has no regional  wall motion abnormalities. The left ventricular internal cavity size was  mildly dilated. Left ventricular  diastolic parameters are consistent with Grade I diastolic dysfunction  (impaired relaxation). Elevated left atrial pressure.  3. Right ventricular systolic function is normal. The right ventricular  size is normal.  4. The mitral valve is normal in structure. Trivial mitral valve  regurgitation. No evidence of mitral stenosis.  5. The aortic valve is bicuspid. Aortic valve regurgitation is severe. No  aortic stenosis is present.  6. Aortic dilatation noted. There is mild dilatation of the aortic root  measuring 38 mm.  7. The inferior vena cava is normal in size with greater than 50%  respiratory variability, suggesting right atrial pressure of 3 mmHg.   FINDINGS  Left Ventricle: Left ventricular ejection fraction, by estimation, is 55  to 60%. The left ventricle has normal function. The left ventricle has no  regional wall motion abnormalities. Definity contrast agent was given IV  to delineate the left ventricular  endocardial borders. The left ventricular internal cavity size was mildly  dilated. There is no left ventricular hypertrophy. Left ventricular  diastolic parameters are consistent with Grade I diastolic dysfunction  (impaired relaxation). Elevated left  atrial pressure.   Right Ventricle: The  right ventricular size is normal.Right ventricular  systolic function is normal.   Left Atrium: Left atrial size was normal in size.   Right Atrium: Right atrial size was normal in size.   Pericardium: There is no evidence of pericardial effusion.   Mitral Valve: The mitral valve is normal in structure. Normal mobility of  the mitral valve leaflets. Trivial mitral valve regurgitation. No evidence  of mitral valve stenosis.   Tricuspid Valve: The tricuspid valve is normal in structure. Tricuspid  valve regurgitation is trivial. No evidence of tricuspid stenosis.   Aortic Valve: The aortic valve is bicuspid. Aortic valve regurgitation is  severe. Aortic regurgitation PHT measures 254 msec. No aortic stenosis is  present. Aortic valve mean gradient measures 6.4 mmHg. Aortic valve peak  gradient measures 10.8 mmHg.  Aortic valve area, by VTI measures 2.69 cm.   Pulmonic Valve: The pulmonic valve was not well visualized. Pulmonic valve  regurgitation is not visualized. No evidence of pulmonic stenosis.   Aorta: Aortic dilatation noted. There is mild dilatation of the aortic  root measuring 38 mm.   Venous: The inferior vena cava is normal in size with greater than 50%  respiratory variability, suggesting right atrial pressure of 3 mmHg.   IAS/Shunts: No atrial level shunt detected by color flow Doppler.   Additional Comments: Definity used; normal LV systolic function; grade 1  diastolic dysfunction; mild LVE; mildly dilated aortic root; bicuspid  aortic valve with severe eccentic AI;   elevated gradient in descending  aorta noted on previous echo not  demonstrated on this study.     LEFT VENTRICLE  PLAX 2D  LVIDd:     5.80 cm Diastology  LVIDs:     4.90 cm LV e' lateral:  7.18 cm/s  LV PW:     0.90 cm LV E/e' lateral: 16.3  LV IVS:    0.90 cm LV e' medial:  6.20 cm/s  LVOT diam:   2.50 cm LV E/e' medial: 18.9  LV SV:     94  LV SV Index:  43   LVOT Area:   4.91 cm     RIGHT VENTRICLE  RV Basal diam: 3.00 cm  RV S prime:   12.40 cm/s  TAPSE (M-mode): 2.3 cm   LEFT ATRIUM       Index    RIGHT ATRIUM      Index  LA diam:    4.30 cm 1.97 cm/m RA Area:   11.30 cm  LA Vol (A2C):  76.8 ml 35.26 ml/m RA Volume:  25.00 ml 11.48 ml/m  LA Vol (A4C):  50.2 ml 23.05 ml/m  LA Biplane Vol: 67.4 ml 30.94 ml/m  AORTIC VALVE          PULMONIC VALVE  AV Area (Vmax):  2.49 cm   PV Vmax:    0.81 m/s  AV Area (Vmean):  2.56 cm   PV Vmean:   63.420 cm/s  AV Area (VTI):   2.69 cm   PV VTI:    0.150 m  AV Vmax:      164.00 cm/s PV Peak grad: 2.6 mmHg  AV Vmean:     121.800 cm/s PV Mean grad: 2.0 mmHg  AV VTI:      0.351 m  AV Peak Grad:   10.8 mmHg  AV Mean Grad:   6.4 mmHg  LVOT Vmax:     83.27 cm/s  LVOT Vmean:    63.500 cm/s  LVOT VTI:     0.192 m  LVOT/AV VTI ratio: 0.55  AI PHT:      254 msec    AORTA  Ao Root diam: 3.80 cm  Ao Asc diam: 3.70 cm   MITRAL VALVE  MV Area (PHT): 3.37 cm   SHUNTS  MV Decel Time: 225 msec   Systemic VTI: 0.19 m  MV E velocity: 117.00 cm/s Systemic Diam: 2.50 cm  MV A velocity: 125.00 cm/s  MV E/A ratio: 0.94   Brian Crenshaw MD  Electronically signed by Brian Crenshaw MD  Signature Date/Time: 05/31/2020/12:16:41 PM      TRANSESOPHOGEAL ECHO REPORT       Patient Name:  Dean Molina Date of Exam: 12/23/2019  Medical Rec #: [id number]   Height:    66.0 in  Accession #:  [id number]   Weight:    230.0 lb  Date of Birth: 10/21/1976   BSA:     2.12 m  Patient Age:  43 years    BP:      119/66 mmHg  Patient Gender: M       HR:      92 bpm.  Exam Location: Inpatient   Procedure: Transesophageal Echo   Indications:   Aortic valve insufficiency    History:     Patient has prior history of Echocardiogram  examinations,  most          recent 09/24/2019. Bicuspid aortic valve; Risk          Factors:Hypertension and   Dyslipidemia. S/P repair of          coarctation of aorta.    Sonographer:   Chelsea Turrentine  Referring Phys: [id number] Dean Molina  Diagnosing Phys: Dean Croitoru MD   PROCEDURE: The transesophogeal probe was passed without difficulty through  the esophogus of the patient. Sedation performed by different physician.  The patient was monitored while under deep sedation. Anesthestetic  sedation was provided intravenously by  Anesthesiology: 40mg of Propofol. The patient developed no complications  during the procedure.   IMPRESSIONS    1. Left ventricular ejection fraction, by estimation, is 60 to 65%. The  left ventricle has normal function. The left ventricle has no regional  wall motion abnormalities. The left ventricular internal cavity size was  mildly dilated. Left ventricular  diastolic function could not be evaluated.  2. Right ventricular systolic function is normal. The right ventricular  size is normal.  3. No left atrial/left atrial appendage thrombus was detected.  4. The mitral valve is normal in structure and function. Mild mitral  valve regurgitation.  5. The aortic valve is bicuspid. Aortic valve regurgitation is severe.  Mild aortic valve sclerosis is present, with no evidence of aortic valve  stenosis.  6. The aortic annulus measures 2.5 cm. The aortic root (3.5 cm) and  proximal ascending aorta (2.9 cm) are normal in caliber. The mid-arch is  relatively smaller in caliber at 1.7 cm. The distal aortic arch is ectatic  at the site of previous coarctation  repair and left subclavian takeoff with a maximum diameter of 3.4 cm,  while the descending aorta is relatively small at 1.9 cm. While no true  coarctation is seen, there is a 50% "step-down" in aortic caliber. No  atheroma is seen. Aortic dilatation noted.    FINDINGS  Left Ventricle: Left ventricular ejection fraction, by estimation, is 60  to 65%. The left ventricle has normal function. The left ventricle has no  regional wall motion abnormalities. The left ventricular internal cavity  size was mildly dilated. There is  no left ventricular hypertrophy. Left ventricular diastolic function  could not be evaluated.   Right Ventricle: The right ventricular size is normal. No increase in  right ventricular wall thickness. Right ventricular systolic function is  normal.   Left Atrium: Left atrial size was normal in size. No left atrial/left  atrial appendage thrombus was detected.   Right Atrium: Right atrial size was normal in size.   Pericardium: There is no evidence of pericardial effusion.   Mitral Valve: The mitral valve is normal in structure and function. Mild  mitral valve regurgitation.   Tricuspid Valve: The tricuspid valve is normal in structure. Tricuspid  valve regurgitation is not demonstrated.   Aortic Valve: The aortic valve is bicuspid. Aortic valve regurgitation is  severe. Vena contracta 5 mm. Mild aortic valve sclerosis is present, with  no evidence of aortic valve stenosis. The coronary ostia appear to be  located normally, although the right  coronary ostia was harder to identify.   Pulmonic Valve: The pulmonic valve was normal in structure. Pulmonic valve  regurgitation is trivial.   Aorta: The aortic annulus measures 2.5 cm. The aortic root (3.5 cm) and  proximal ascending aorta (2.9 cm) are normal in caliber. The mid-arch is  relatively smaller in caliber at 1.7 cm. The distal aortic arch is ectatic  at the site of previous  coarctation repair and left subclavian takeoff with a maximum diameter of  3.4 cm, while the   descending aorta is relatively small at 1.9 cm. While no  true coarctation is seen, there is a 50% "step-down" in aortic caliber. No  atheroma is seen. Aortic  dilatation noted.    IAS/Shunts: No atrial level shunt detected by color flow Doppler.     LEFT VENTRICLE  PLAX 2D  LVOT diam:   2.53 cm  LVOT Area:   5.04 cm       AORTA  Ao Asc diam: 2.87 cm     SHUNTS  Systemic Diam: 2.53 cm   Dean Croitoru MD  Electronically signed by Dean Croitoru MD  Signature Date/Time: 12/23/2019/4:02:33 PM      Cardiac/Coronary CTA  TECHNIQUE: The patient was scanned on a Phillips Force scanner. A 100 kV prospective scan was triggered in the descending thoracic aorta at 111 HU's. Axial non-contrast 3 mm slices were carried out through the heart. The data set was analyzed on a dedicated work station and scored using the Agatson method. Gantry rotation speed was 250 msecs and collimation was .6 mm. 5 mg IV metoprolol and 0.8 mg of sl NTG was given. The 3D data set was reconstructed in 5% intervals of the 35-75 % of the R-R cycle. Diastolic phases were analyzed on a dedicated work station using MPR, MIP and VRT modes. The patient received 80 cc of contrast.  FINDINGS: Image quality: excellent.  Noise artifact is: Limited  Coronary Arteries:  Normal coronary origin.  Left dominance.  Left main: The left main is a large caliber vessel with a normal take off from the fused RCC/LCC that bifurcates to form a left anterior descending artery and a left circumflex artery. There is no plaque or stenosis.  Left anterior descending artery: The LAD is patent without evidence of plaque or stenosis. 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 The LAD gives off 2 patent diagonal branches.  Left circumflex artery: The LCX is dominant and patent with no evidence of plaque or stenosis. The LCX gives off 1 very large patent obtuse marginal branch with take off from the proximal LCX. The LCX terminates as a PDA without evidence of plaque or stenosis  Right coronary artery: The RCA is non-dominant with normal take off from the fused RCC/LCC. There is no  evidence of plaque or stenosis.  Right Atrium: Right atrial size is within normal limits.  Right Ventricle: The right ventricular cavity is within normal limits.  Left Atrium: Left atrial size is normal in size with no left atrial appendage filling defect.  Left Ventricle: The ventricular cavity size is within normal limits. There are no stigmata of prior infarction. There is no abnormal filling defect.  Pulmonary arteries: Normal in size without proximal filling defect.  Pulmonary veins: Normal pulmonary venous drainage.  Pericardium: Normal thickness with no significant effusion or calcium present.  Cardiac valves: The aortic valve is bicuspid with fusion of the RCC/LCC with raphe (Sievers type 1). There is severe prolapse of the RCC/LCC. The mitral valve is normal structure without significant calcification.  Aorta: Normal caliber in the visualized segments. No significant disease.  Extra-cardiac findings: See attached radiology report for non-cardiac structures.  IMPRESSION: 1. Coronary calcium score of 0.  2. Normal coronary origin with left dominance.  3. Normal coronary arteries.  4. 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  5. Mid LAD myocardial bridge.  Jasper T. O'Neal, MD   Electronically Signed   By: Monroe  O'Neal   On: 02/11/2020   12:48    Impression:  Patient has bicuspid aortic valve with early stage D severe symptomatic primary aortic insufficiency.  Although he is doing well clinically he does admit to worsening fatigue and a tendency to get short of breath with more strenuous physical exertion, consistent with chronic diastolic congestive heart failure, [place] Heart Association functional class I-II  I have personally reviewed the patient's recent transthoracic echocardiogram and previous transesophageal echocardiogram and cardiac gated CT angiogram of the heart. Patient  has Sievers type I bicuspid aortic valve with fusion of the left and right leaflets and a single raphae. There is no significant calcification and the fused left-right cusp is severely prolapsing. The size of the 3 cusps are somewhat asymmetrical with the noncoronary cusp considerably larger than both the left and the right cusps. Aortic valve and aortic root is normal to relatively large in size and with the absence of any significant calcification.  Anatomical characteristics appear favorable for an attempt at valve repair rather than replacement. Left ventricular systolic function appears normal. There is some left ventricular chamber enlargement.Left ventricular dimensions were not reported on TEE but on cardiac gated CT angiogram of the heart left ventricular end-diastolic diameter is notably measured greater than 6.8 cm. Patient does not appear to have significant coronary artery disease and coronary calcium score was 0, although the scan was interpreted as not being sufficient for evaluation of plaque. Patient does have mild narrowing of the distal aortic arch at the site of previous coarctation repair, but this appears relatively mild and there does not appear to be significant recurrent coarctation.  Patient would best be treated with elective aortic valve repair or replacement.  He is now starting to experience some mild symptoms.  Aortic insufficiency is quite severe.  Left ventricular function appears preserved.Based upon the functionalanatomy of the valve I favor an attempt at repair which could potentially provide excellent long-term durable result without need for anticoagulation nor concerns about risk of late structural valve deterioration and failure.    Plan:  The patient and his mother werecounseled at length regarding treatment alternatives for management of severe aortic insufficiencyincluding continued medical therapy versus proceeding with aortic valve repair  orreplacement in the near future. The natural history of aortic insufficiencywas reviewed, as was long term prognosis with medical therapy alone.The relatively favorable anatomy for possible valve repair was discussed.   In the event that successful valve repair is not feasible, discussion was held comparing the relative risks of mechanical valve replacement with need for lifelong anticoagulation versus use of a bioprosthetic tissue valve and the associated potential for late structural valve deterioration and failure.  This discussion was placed in the context of the patient's particular circumstances, and as a result the patient specifically requests that their valve be replaced using a mechanical valve.  The patient understands and accepts all potential associated risks of surgery including but not limited to risk of death, stroke, myocardial infarction, congestive heart failure, respiratory failure, renal failure, pneumonia, bleeding requiring blood transfusion and or reexploration, arrhythmia, heart block or bradycardia requiring permanent pacemaker, aortic dissection or other major vascular complication, pleural effusions or other delayed complications related to continued congestive heart failure, and other late complications related to valve replacement including structural valve deterioration and failure, thrombosis, endocarditis, or paravalvular leak.  For OR on 07/27/2020 as previously planned.       I spent in excess of 15 minutes during the conduct of this office consultation and >50% of this time involved   direct face-to-face encounter with the patient for counseling and/or coordination of their care.    Jaran Sainz H. Sophira Rumler, MD 07/25/2020 11:58 AM  

## 2020-07-25 NOTE — Progress Notes (Signed)
Pre-CABG Dopplers completed. Refer to "CV Proc" under chart review to view preliminary results.  07/25/2020 10:35 AM Eula Fried., MHA, RVT, RDCS, RDMS

## 2020-07-25 NOTE — Progress Notes (Signed)
[address]       Dean Molina [address]             [phone]     CARDIOTHORACIC SURGERY CONSULTATION REPORT  Referring Provider is Thomasene Ripple, DO PCP is Dean Joe, MD  Chief Complaint  Patient presents with  . Aortic Stenosis    further discuss surgery scheduled for 07/27/20    HPI:  Patient is a 44 year old moderately obese male with history of congenital heart disease,hypertension, hyperlipidemia, and GE reflux disease whoreturns to the office today for further discuss treatment options for management of bicuspid aortic valve with severe primary aortic insufficiency.   He was initially seen in consultation on November 30, 2019 and shortly after that on December 28, 2019.  At that time the patient reported to be asymptomatic.  We discussed the rationale for elective surgical intervention as well as the alternative of continued watchful waiting with close medical follow-up.  The patient expressed considerable anxiety about the possibility of proceeding with surgery and requested follow-up appointment in 6 months.    Patient returns to the office on June 13, 2020 for follow-up and had recently undergone routine follow-up echocardiogram which revealed severe aortic insufficiency with preserved left ventricular systolic function.  No significant changes in the left ventricular chamber size were reported.  The patient states that he is now interested in proceeding with elective surgery.  Although he still is doing well, he admits to decreased energy and a tendency to get short of breath with more strenuous exertion such as going up more than 1 flight of stairs.  He only gets short of breath with strenuous activity and overall he has not had to limit his activities.  He is still trying to exercise on a regular basis, and he states that he was recently able to walk 2 miles at a normal pace without difficulty.  He specifically denies any history of resting shortness  of breath, PND, orthopnea, or lower extremity edema.  He has not had any chest pain or chest tightness either with activity or at rest.  At that time we made tentative plans for elective aortic valve repair on July 27, 2020.  Patient returns to the office today with his mother present for follow-up consultation prior to surgery.  He reports no new problems or complaints.  He specifically denies any problems with shortness of breath either with activity or at rest.  He has not had any chest pain or chest tightness.  He has not had fevers, chills, nor productive cough.  He has not been exposed to any persons with known or suspected COVID-19 infection.  He has been fully vaccinated against the COVID-19 virus.   Past Medical History:  Diagnosis Date  . Acid reflux   . Aortic insufficiency due to bicuspid aortic valve   . Bicuspid aortic valve   . Coarctation of the aorta, s/p repair   . Essential hypertension 10/12/2019  . GERD (gastroesophageal reflux disease)   . Hyperlipidemia   . Hypertension   . Mixed hyperlipidemia 10/12/2019  . Nonrheumatic aortic valve insufficiency 10/12/2019  . Obesity (BMI 30-39.9)   . pulmonic valve stenosis s/p balloon valvuloplasty   . S/P repair of coarctation of aorta 10/12/2019    Past Surgical History:  Procedure Laterality Date  . CARDIAC CATHETERIZATION  1988  . PERCUTANEOUS BALLOON VALVULOPLASTY     pulmonic valve  . status repair of coarctation of the aorta    .  SUPRAVALVULAR AORTIC STENOSIS REPAIR  1977  . TEE WITHOUT CARDIOVERSION N/A 12/23/2019   Procedure: TRANSESOPHAGEAL ECHOCARDIOGRAM (TEE);  Surgeon: Thurmon Fair, MD;  Location: [facility] ENDOSCOPY;  Service: Cardiovascular;  Laterality: N/A;    Family History  Problem Relation Age of Onset  . Hypertension Mother   . Arthritis Mother   . Diabetes Father   . Hypertension Father     Social History   Socioeconomic History  . Marital status: Legally Separated    Spouse name: Not on file    . Number of children: Not on file  . Years of education: Not on file  . Highest education level: Not on file  Occupational History  . Not on file  Tobacco Use  . Smoking status: Former Smoker    Quit date: 10/11/2009    Years since quitting: 10.7  . Smokeless tobacco: Never Used  Substance and Sexual Activity  . Alcohol use: Not Currently    Comment: Rarely  . Drug use: Never  . Sexual activity: Not on file  Other Topics Concern  . Not on file  Social History Narrative  . Not on file   Social Determinants of Health   Financial Resource Strain:   . Difficulty of Paying Living Expenses: Not on file  Food Insecurity:   . Worried About Programme researcher, broadcasting/film/video in the Last Year: Not on file  . Ran Out of Food in the Last Year: Not on file  Transportation Needs:   . Lack of Transportation (Medical): Not on file  . Lack of Transportation (Non-Medical): Not on file  Physical Activity:   . Days of Exercise per Week: Not on file  . Minutes of Exercise per Session: Not on file  Stress:   . Feeling of Stress : Not on file  Social Connections:   . Frequency of Communication with Friends and Family: Not on file  . Frequency of Social Gatherings with Friends and Family: Not on file  . Attends Religious Services: Not on file  . Active Member of Clubs or Organizations: Not on file  . Attends Banker Meetings: Not on file  . Marital Status: Not on file  Intimate Partner Violence:   . Fear of Current or Ex-Partner: Not on file  . Emotionally Abused: Not on file  . Physically Abused: Not on file  . Sexually Abused: Not on file    Current Outpatient Medications  Medication Sig Dispense Refill  . GARLIC PO Take 1 tablet by mouth daily.     Marland Kitchen lovastatin (MEVACOR) 40 MG tablet Take 40 mg by mouth daily.     . Misc Natural Products (GINSENG COMPLEX PO) Take 1 tablet by mouth daily.     . Multiple Vitamins-Minerals (MULTIVITAMIN WITH MINERALS) tablet Take 1 tablet by mouth daily.      Marland Kitchen omeprazole (PRILOSEC) 20 MG capsule Take 20 mg by mouth daily.     Marland Kitchen OVER THE COUNTER MEDICATION Take 2 tablets by mouth in the morning and at bedtime. Goli apple cider vinegar     . valsartan-hydrochlorothiazide (DIOVAN-HCT) 160-25 MG tablet Take 1 tablet by mouth daily.     . Zinc 50 MG CAPS Take 50 mg by mouth daily.      No current facility-administered medications for this visit.    Allergies  Allergen Reactions  . Tamiflu [Oseltamivir Phosphate] Nausea And Vomiting      Review of Systems:   General:  normal appetite, decreased energy, no weight gain, no weight  loss, no fever  Cardiac:  no chest pain with exertion, no chest pain at rest, +SOB with more strenuous exertion, no resting SOB, no PND, no orthopnea, no palpitations, no arrhythmia, no atrial fibrillation, no LE edema, no dizzy spells, no syncope  Respiratory:  no shortness of breath, no home oxygen, no productive cough, no dry cough, no bronchitis, no wheezing, no hemoptysis, no asthma, no pain with inspiration or cough, no sleep apnea, no CPAP at night  GI:   no difficulty swallowing, + reflux, no frequent heartburn, no hiatal hernia, no abdominal pain, no constipation, no diarrhea, no hematochezia, no hematemesis, no melena  GU:   no dysuria,  no frequency, no urinary tract infection, no hematuria, no enlarged prostate, no kidney stones, no kidney disease  Vascular:  no pain suggestive of claudication, no pain in feet, no leg cramps, no varicose veins, no DVT, no non-healing foot ulcer  Neuro:   no stroke, no TIA's, no seizures, no headaches, no temporary blindness one eye,  no slurred speech, no peripheral neuropathy, no chronic pain, no instability of gait, no memory/cognitive dysfunction  Musculoskeletal: no arthritis, no joint swelling, no myalgias, no difficulty walking, normal mobility   Skin:   no rash, no itching, no skin infections, no pressure sores or ulcerations  Psych:   no anxiety, no depression, +  nervousness, no unusual recent stress  Eyes:   no blurry vision, no floaters, no recent vision changes, + wears glasses or contacts  ENT:   no hearing loss, no loose or painful teeth, no dentures, last saw dentist within the past 6 months  Hematologic:  no easy bruising, no abnormal bleeding, no clotting disorder, no frequent epistaxis  Endocrine:  no diabetes, does not check CBG's at home     Physical Exam:   BP (!) 148/79   Pulse 80   Temp 97.7 F (36.5 C) (Skin)   Resp 20   Ht 5\' 7"  (1.702 m)   Wt 237 lb (107.5 kg)   SpO2 96%   BMI 37.12 kg/m   General:  Moderately obese,  well-appearing  HEENT:  Unremarkable   Neck:   no JVD, no bruits, no adenopathy   Chest:   clear to auscultation, symmetrical breath sounds, no wheezes, no rhonchi   CV:   RRR, grade III/VI diastolic murmur   Abdomen:  soft, non-tender, no masses   Extremities:  warm, well-perfused, pulses palpable, no LE edema  Rectal/GU  Deferred  Neuro:   Grossly non-focal and symmetrical throughout  Skin:   Clean and dry, no rashes, no breakdown   Diagnostic Tests:   ECHOCARDIOGRAM REPORT       Patient Name:  Dean Molina Date of Exam: 05/31/2020  Medical Rec #: [id number]   Height:    67.0 in  Accession #:  [id number]   Weight:    238.2 lb  Date of Birth: 06-29-1976   BSA:     2.178 m  Patient Age:  43 years    BP:      132/70 mmHg  Patient Gender: M       HR:      81 bpm.  Exam Location: [address]   Procedure: 2D Echo, Cardiac Doppler, Color Doppler and Intracardiac       Opacification Agent   Indications:  I35.0 Aortic Stenosis    History:    Patient has prior history of Echocardiogram examinations,  most         recent 09/24/2019. Coarctation repair-18  months; Risk         Factors:Obesity, Dyslipidemia and Hypertension. Bicuspid  Aortic         Valve. Pulmonary valvuloplasty.    Sonographer:  Sedonia Small  Rodgers-Jones RDCS  Referring Phys: [id number] Timarion Agcaoili H Antwine Agosto     Sonographer Comments: Technically difficult study due to poor echo  windows.  IMPRESSIONS    1. Definity used; normal LV systolic function; grade 1 diastolic  dysfunction; mild LVE; mildly dilated aortic root; bicuspid aortic valve  with severe eccentic AI; elevated gradient in descending aorta noted on  previous echo not demonstrated on this  study.  2. Left ventricular ejection fraction, by estimation, is 55 to 60%. The  left ventricle has normal function. The left ventricle has no regional  wall motion abnormalities. The left ventricular internal cavity size was  mildly dilated. Left ventricular  diastolic parameters are consistent with Grade I diastolic dysfunction  (impaired relaxation). Elevated left atrial pressure.  3. Right ventricular systolic function is normal. The right ventricular  size is normal.  4. The mitral valve is normal in structure. Trivial mitral valve  regurgitation. No evidence of mitral stenosis.  5. The aortic valve is bicuspid. Aortic valve regurgitation is severe. No  aortic stenosis is present.  6. Aortic dilatation noted. There is mild dilatation of the aortic root  measuring 38 mm.  7. The inferior vena cava is normal in size with greater than 50%  respiratory variability, suggesting right atrial pressure of 3 mmHg.   FINDINGS  Left Ventricle: Left ventricular ejection fraction, by estimation, is 55  to 60%. The left ventricle has normal function. The left ventricle has no  regional wall motion abnormalities. Definity contrast agent was given IV  to delineate the left ventricular  endocardial borders. The left ventricular internal cavity size was mildly  dilated. There is no left ventricular hypertrophy. Left ventricular  diastolic parameters are consistent with Grade I diastolic dysfunction  (impaired relaxation). Elevated left  atrial pressure.   Right Ventricle: The  right ventricular size is normal.Right ventricular  systolic function is normal.   Left Atrium: Left atrial size was normal in size.   Right Atrium: Right atrial size was normal in size.   Pericardium: There is no evidence of pericardial effusion.   Mitral Valve: The mitral valve is normal in structure. Normal mobility of  the mitral valve leaflets. Trivial mitral valve regurgitation. No evidence  of mitral valve stenosis.   Tricuspid Valve: The tricuspid valve is normal in structure. Tricuspid  valve regurgitation is trivial. No evidence of tricuspid stenosis.   Aortic Valve: The aortic valve is bicuspid. Aortic valve regurgitation is  severe. Aortic regurgitation PHT measures 254 msec. No aortic stenosis is  present. Aortic valve mean gradient measures 6.4 mmHg. Aortic valve peak  gradient measures 10.8 mmHg.  Aortic valve area, by VTI measures 2.69 cm.   Pulmonic Valve: The pulmonic valve was not well visualized. Pulmonic valve  regurgitation is not visualized. No evidence of pulmonic stenosis.   Aorta: Aortic dilatation noted. There is mild dilatation of the aortic  root measuring 38 mm.   Venous: The inferior vena cava is normal in size with greater than 50%  respiratory variability, suggesting right atrial pressure of 3 mmHg.   IAS/Shunts: No atrial level shunt detected by color flow Doppler.   Additional Comments: Definity used; normal LV systolic function; grade 1  diastolic dysfunction; mild LVE; mildly dilated aortic root; bicuspid  aortic valve with severe eccentic AI;  elevated gradient in descending  aorta noted on previous echo not  demonstrated on this study.     LEFT VENTRICLE  PLAX 2D  LVIDd:     5.80 cm Diastology  LVIDs:     4.90 cm LV e' lateral:  7.18 cm/s  LV PW:     0.90 cm LV E/e' lateral: 16.3  LV IVS:    0.90 cm LV e' medial:  6.20 cm/s  LVOT diam:   2.50 cm LV E/e' medial: 18.9  LV SV:     94  LV SV Index:  43   LVOT Area:   4.91 cm     RIGHT VENTRICLE  RV Basal diam: 3.00 cm  RV S prime:   12.40 cm/s  TAPSE (M-mode): 2.3 cm   LEFT ATRIUM       Index    RIGHT ATRIUM      Index  LA diam:    4.30 cm 1.97 cm/m RA Area:   11.30 cm  LA Vol (A2C):  76.8 ml 35.26 ml/m RA Volume:  25.00 ml 11.48 ml/m  LA Vol (A4C):  50.2 ml 23.05 ml/m  LA Biplane Vol: 67.4 ml 30.94 ml/m  AORTIC VALVE          PULMONIC VALVE  AV Area (Vmax):  2.49 cm   PV Vmax:    0.81 m/s  AV Area (Vmean):  2.56 cm   PV Vmean:   63.420 cm/s  AV Area (VTI):   2.69 cm   PV VTI:    0.150 m  AV Vmax:      164.00 cm/s PV Peak grad: 2.6 mmHg  AV Vmean:     121.800 cm/s PV Mean grad: 2.0 mmHg  AV VTI:      0.351 m  AV Peak Grad:   10.8 mmHg  AV Mean Grad:   6.4 mmHg  LVOT Vmax:     83.27 cm/s  LVOT Vmean:    63.500 cm/s  LVOT VTI:     0.192 m  LVOT/AV VTI ratio: 0.55  AI PHT:      254 msec    AORTA  Ao Root diam: 3.80 cm  Ao Asc diam: 3.70 cm   MITRAL VALVE  MV Area (PHT): 3.37 cm   SHUNTS  MV Decel Time: 225 msec   Systemic VTI: 0.19 m  MV E velocity: 117.00 cm/s Systemic Diam: 2.50 cm  MV A velocity: 125.00 cm/s  MV E/A ratio: 0.94   Olga Millers MD  Electronically signed by Olga Millers MD  Signature Date/Time: 05/31/2020/12:16:41 PM      TRANSESOPHOGEAL ECHO REPORT       Patient Name:  Lannie Fields Date of Exam: 12/23/2019  Medical Rec #: [id number]   Height:    66.0 in  Accession #:  [id number]   Weight:    230.0 lb  Date of Birth: Mar 09, 1976   BSA:     2.12 m  Patient Age:  43 years    BP:      119/66 mmHg  Patient Gender: M       HR:      92 bpm.  Exam Location: Inpatient   Procedure: Transesophageal Echo   Indications:   Aortic valve insufficiency    History:     Patient has prior history of Echocardiogram  examinations,  most          recent 09/24/2019. Bicuspid aortic valve; Risk          Factors:Hypertension and  Dyslipidemia. S/P repair of          coarctation of aorta.    Sonographer:   Leeroy Bockhelsea Turrentine  Referring Phys: [phone] MIHAI CROITORU  Diagnosing Phys: Thurmon FairMihai Croitoru MD   PROCEDURE: The transesophogeal probe was passed without difficulty through  the esophogus of the patient. Sedation performed by different physician.  The patient was monitored while under deep sedation. Anesthestetic  sedation was provided intravenously by  Anesthesiology: 40mg  of Propofol. The patient developed no complications  during the procedure.   IMPRESSIONS    1. Left ventricular ejection fraction, by estimation, is 60 to 65%. The  left ventricle has normal function. The left ventricle has no regional  wall motion abnormalities. The left ventricular internal cavity size was  mildly dilated. Left ventricular  diastolic function could not be evaluated.  2. Right ventricular systolic function is normal. The right ventricular  size is normal.  3. No left atrial/left atrial appendage thrombus was detected.  4. The mitral valve is normal in structure and function. Mild mitral  valve regurgitation.  5. The aortic valve is bicuspid. Aortic valve regurgitation is severe.  Mild aortic valve sclerosis is present, with no evidence of aortic valve  stenosis.  6. The aortic annulus measures 2.5 cm. The aortic root (3.5 cm) and  proximal ascending aorta (2.9 cm) are normal in caliber. The mid-arch is  relatively smaller in caliber at 1.7 cm. The distal aortic arch is ectatic  at the site of previous coarctation  repair and left subclavian takeoff with a maximum diameter of 3.4 cm,  while the descending aorta is relatively small at 1.9 cm. While no true  coarctation is seen, there is a 50% "step-down" in aortic caliber. No  atheroma is seen. Aortic dilatation noted.    FINDINGS  Left Ventricle: Left ventricular ejection fraction, by estimation, is 60  to 65%. The left ventricle has normal function. The left ventricle has no  regional wall motion abnormalities. The left ventricular internal cavity  size was mildly dilated. There is  no left ventricular hypertrophy. Left ventricular diastolic function  could not be evaluated.   Right Ventricle: The right ventricular size is normal. No increase in  right ventricular wall thickness. Right ventricular systolic function is  normal.   Left Atrium: Left atrial size was normal in size. No left atrial/left  atrial appendage thrombus was detected.   Right Atrium: Right atrial size was normal in size.   Pericardium: There is no evidence of pericardial effusion.   Mitral Valve: The mitral valve is normal in structure and function. Mild  mitral valve regurgitation.   Tricuspid Valve: The tricuspid valve is normal in structure. Tricuspid  valve regurgitation is not demonstrated.   Aortic Valve: The aortic valve is bicuspid. Aortic valve regurgitation is  severe. Vena contracta 5 mm. Mild aortic valve sclerosis is present, with  no evidence of aortic valve stenosis. The coronary ostia appear to be  located normally, although the right  coronary ostia was harder to identify.   Pulmonic Valve: The pulmonic valve was normal in structure. Pulmonic valve  regurgitation is trivial.   Aorta: The aortic annulus measures 2.5 cm. The aortic root (3.5 cm) and  proximal ascending aorta (2.9 cm) are normal in caliber. The mid-arch is  relatively smaller in caliber at 1.7 cm. The distal aortic arch is ectatic  at the site of previous  coarctation repair and left subclavian takeoff with a maximum diameter of  3.4 cm, while the  descending aorta is relatively small at 1.9 cm. While no  true coarctation is seen, there is a 50% "step-down" in aortic caliber. No  atheroma is seen. Aortic  dilatation noted.    IAS/Shunts: No atrial level shunt detected by color flow Doppler.     LEFT VENTRICLE  PLAX 2D  LVOT diam:   2.53 cm  LVOT Area:   5.04 cm       AORTA  Ao Asc diam: 2.87 cm     SHUNTS  Systemic Diam: 2.53 cm   Rachelle Hora Croitoru MD  Electronically signed by Thurmon Fair MD  Signature Date/Time: 12/23/2019/4:02:33 PM      Cardiac/Coronary CTA  TECHNIQUE: The patient was scanned on a [employer]. A 100 kV prospective scan was triggered in the descending thoracic aorta at 111 HU's. Axial non-contrast 3 mm slices were carried out through the heart. The data set was analyzed on a dedicated work station and scored using the Agatson method. Gantry rotation speed was 250 msecs and collimation was .6 mm. 5 mg IV metoprolol and 0.8 mg of sl NTG was given. The 3D data set was reconstructed in 5% intervals of the 35-75 % of the R-R cycle. Diastolic phases were analyzed on a dedicated work station using MPR, MIP and VRT modes. The patient received 80 cc of contrast.  FINDINGS: Image quality: excellent.  Noise artifact is: Limited  Coronary Arteries:  Normal coronary origin.  Left dominance.  Left main: The left main is a large caliber vessel with a normal take off from the fused RCC/LCC that bifurcates to form a left anterior descending artery and a left circumflex artery. There is no plaque or stenosis.  Left anterior descending artery: The LAD is patent without evidence of plaque or stenosis. 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 The LAD gives off 2 patent diagonal branches.  Left circumflex artery: The LCX is dominant and patent with no evidence of plaque or stenosis. The LCX gives off 1 very large patent obtuse marginal branch with take off from the proximal LCX. The LCX terminates as a PDA without evidence of plaque or stenosis  Right coronary artery: The RCA is non-dominant with normal take off from the fused RCC/LCC. There is no  evidence of plaque or stenosis.  Right Atrium: Right atrial size is within normal limits.  Right Ventricle: The right ventricular cavity is within normal limits.  Left Atrium: Left atrial size is normal in size with no left atrial appendage filling defect.  Left Ventricle: The ventricular cavity size is within normal limits. There are no stigmata of prior infarction. There is no abnormal filling defect.  Pulmonary arteries: Normal in size without proximal filling defect.  Pulmonary veins: Normal pulmonary venous drainage.  Pericardium: Normal thickness with no significant effusion or calcium present.  Cardiac valves: The aortic valve is bicuspid with fusion of the RCC/LCC with raphe Ileene Rubens type 1). There is severe prolapse of the RCC/LCC. The mitral valve is normal structure without significant calcification.  Aorta: Normal caliber in the visualized segments. No significant disease.  Extra-cardiac findings: See attached radiology report for non-cardiac structures.  IMPRESSION: 1. Coronary calcium score of 0.  2. Normal coronary origin with left dominance.  3. Normal coronary arteries.  4. 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  5. Mid LAD myocardial bridge.  Gerri Spore T. Flora Lipps, MD   Electronically Signed   By: Lennie Odor   On: 02/11/2020  12:48    Impression:  Patient has bicuspid aortic valve with early stage D severe symptomatic primary aortic insufficiency.  Although he is doing well clinically he does admit to worsening fatigue and a tendency to get short of breath with more strenuous physical exertion, consistent with chronic diastolic congestive heart failure, New [place] Heart Association functional class I-II  I have personally reviewed the patient's recent transthoracic echocardiogram and previous transesophageal echocardiogram and cardiac gated CT angiogram of the heart. Patient  has Sievers type I bicuspid aortic valve with fusion of the left and right leaflets and a single raphae. There is no significant calcification and the fused left-right cusp is severely prolapsing. The size of the 3 cusps are somewhat asymmetrical with the noncoronary cusp considerably larger than both the left and the right cusps. Aortic valve and aortic root is normal to relatively large in size and with the absence of any significant calcification.  Anatomical characteristics appear favorable for an attempt at valve repair rather than replacement. Left ventricular systolic function appears normal. There is some left ventricular chamber enlargement.Left ventricular dimensions were not reported on TEE but on cardiac gated CT angiogram of the heart left ventricular end-diastolic diameter is notably measured greater than 6.8 cm. Patient does not appear to have significant coronary artery disease and coronary calcium score was 0, although the scan was interpreted as not being sufficient for evaluation of plaque. Patient does have mild narrowing of the distal aortic arch at the site of previous coarctation repair, but this appears relatively mild and there does not appear to be significant recurrent coarctation.  Patient would best be treated with elective aortic valve repair or replacement.  He is now starting to experience some mild symptoms.  Aortic insufficiency is quite severe.  Left ventricular function appears preserved.Based upon the functionalanatomy of the valve I favor an attempt at repair which could potentially provide excellent long-term durable result without need for anticoagulation nor concerns about risk of late structural valve deterioration and failure.    Plan:  The patient and his mother werecounseled at length regarding treatment alternatives for management of severe aortic insufficiencyincluding continued medical therapy versus proceeding with aortic valve repair  orreplacement in the near future. The natural history of aortic insufficiencywas reviewed, as was long term prognosis with medical therapy alone.The relatively favorable anatomy for possible valve repair was discussed.   In the event that successful valve repair is not feasible, discussion was held comparing the relative risks of mechanical valve replacement with need for lifelong anticoagulation versus use of a bioprosthetic tissue valve and the associated potential for late structural valve deterioration and failure.  This discussion was placed in the context of the patient's particular circumstances, and as a result the patient specifically requests that their valve be replaced using a mechanical valve.  The patient understands and accepts all potential associated risks of surgery including but not limited to risk of death, stroke, myocardial infarction, congestive heart failure, respiratory failure, renal failure, pneumonia, bleeding requiring blood transfusion and or reexploration, arrhythmia, heart block or bradycardia requiring permanent pacemaker, aortic dissection or other major vascular complication, pleural effusions or other delayed complications related to continued congestive heart failure, and other late complications related to valve replacement including structural valve deterioration and failure, thrombosis, endocarditis, or paravalvular leak.  For OR on 07/27/2020 as previously planned.       I spent in excess of 15 minutes during the conduct of this office consultation and >50% of this time involved  direct face-to-face encounter with the patient for counseling and/or coordination of their care.    Salvatore Decent. Cornelius Moras, MD 07/25/2020 11:58 AM

## 2020-07-25 NOTE — Patient Instructions (Signed)
   Continue taking all current medications without change through the day before surgery.  Make sure to bring all of your medications with you when you come for your Pre-Admission Testing appointment at [facility] Short-Stay Department.  Have nothing to eat or drink after midnight the night before surgery.  On the morning of surgery do not take any medications.  At your appointment for Pre-Admission Testing at the [facility] Short-Stay Department you will be asked to sign permission forms for your upcoming surgery.  By definition your signature on these forms implies that you and/or your designee provide full informed consent for your planned surgical procedure(s), that alternative treatment options have been discussed, that you understand and accept any and all potential risks, and that you have some understanding of what to expect for your post-operative convalescence.  For any major cardiac surgical procedure potential operative risks include but are not limited to at least some risk of death, stroke or other neurologic complication, myocardial infarction, congestive heart failure, respiratory failure, renal failure, bleeding requiring blood transfusion and/or reexploration, irregular heart rhythm, heart block or bradycardia requiring permanent pacemaker, pneumonia, pericardial effusion, pleural effusion, wound infection, pulmonary embolus or other thromboembolic complication, chronic pain, or other complications related to the specific procedure(s) performed.  Please call to schedule a follow-up appointment in our office prior to surgery if you have any unresolved questions about your planned surgical procedure, the associated risks, alternative treatment options, and/or expectations for your post-operative recovery.   

## 2020-07-25 NOTE — Progress Notes (Signed)
PCP - Tally Joe @ [facility] Cardiologist -  Thomasene Ripple  PPM/ICD - na   Chest x-ray - 07/25/20 EKG - 07/25/20 Stress Test - na ECHO - 7/21 Cardiac Cath - 1988  Sleep Study - n  Fasting Blood Sugar - na   Blood Thinner Instructions: na Aspirin Instructions: na    COVID TEST-  07/25/20   Anesthesia review:   Patient denies shortness of breath, fever, cough and chest pain at PAT appointment   All instructions explained to the patient, with a verbal understanding of the material. Patient agrees to go over the instructions while at home for a better understanding. Patient also instructed to self quarantine after being tested for COVID-19. The opportunity to ask questions was provided.

## 2020-07-26 ENCOUNTER — Encounter (HOSPITAL_COMMUNITY): Payer: Self-pay | Admitting: Thoracic Surgery (Cardiothoracic Vascular Surgery)

## 2020-07-26 ENCOUNTER — Telehealth: Payer: Self-pay

## 2020-07-26 ENCOUNTER — Encounter (HOSPITAL_COMMUNITY): Payer: Self-pay

## 2020-07-26 MED ORDER — NOREPINEPHRINE 4 MG/250ML-% IV SOLN
0.0000 ug/min | INTRAVENOUS | Status: DC
  Filled 2020-07-26: qty 250

## 2020-07-26 MED ORDER — VANCOMYCIN HCL 1500 MG/300ML IV SOLN
1500.0000 mg | INTRAVENOUS | Status: AC
  Administered 2020-07-27: 1500 mg via INTRAVENOUS
  Filled 2020-07-26: qty 300

## 2020-07-26 MED ORDER — TRANEXAMIC ACID (OHS) BOLUS VIA INFUSION
15.0000 mg/kg | INTRAVENOUS | Status: AC
  Administered 2020-07-27: 1612.5 mg via INTRAVENOUS
  Filled 2020-07-26: qty 1613

## 2020-07-26 MED ORDER — SODIUM CHLORIDE 0.9 % IV SOLN
1.5000 g | INTRAVENOUS | Status: AC
  Administered 2020-07-27: 1.5 g via INTRAVENOUS
  Filled 2020-07-26: qty 1.5

## 2020-07-26 MED ORDER — MILRINONE LACTATE IN DEXTROSE 20-5 MG/100ML-% IV SOLN
0.3000 ug/kg/min | INTRAVENOUS | Status: DC
  Filled 2020-07-26: qty 100

## 2020-07-26 MED ORDER — TRANEXAMIC ACID (OHS) PUMP PRIME SOLUTION
2.0000 mg/kg | INTRAVENOUS | Status: DC
  Filled 2020-07-26: qty 2.15

## 2020-07-26 MED ORDER — MANNITOL 20 % IV SOLN
INTRAVENOUS | Status: DC
  Filled 2020-07-26: qty 13

## 2020-07-26 MED ORDER — SODIUM CHLORIDE 0.9 % IV SOLN
750.0000 mg | INTRAVENOUS | Status: AC
  Administered 2020-07-27: 750 mg via INTRAVENOUS
  Filled 2020-07-26: qty 750

## 2020-07-26 MED ORDER — POTASSIUM CHLORIDE 2 MEQ/ML IV SOLN
80.0000 meq | INTRAVENOUS | Status: DC
  Filled 2020-07-26: qty 40

## 2020-07-26 MED ORDER — PHENYLEPHRINE HCL-NACL 20-0.9 MG/250ML-% IV SOLN
30.0000 ug/min | INTRAVENOUS | Status: AC
  Administered 2020-07-27: 30 ug/min via INTRAVENOUS
  Filled 2020-07-26: qty 250

## 2020-07-26 MED ORDER — DEXMEDETOMIDINE HCL IN NACL 400 MCG/100ML IV SOLN
0.1000 ug/kg/h | INTRAVENOUS | Status: AC
  Administered 2020-07-27: .3 ug/kg/h via INTRAVENOUS
  Filled 2020-07-26: qty 100

## 2020-07-26 MED ORDER — PLASMA-LYTE 148 IV SOLN
INTRAVENOUS | Status: DC
  Filled 2020-07-26: qty 2.5

## 2020-07-26 MED ORDER — SODIUM CHLORIDE 0.9 % IV SOLN
INTRAVENOUS | Status: DC
  Filled 2020-07-26: qty 30

## 2020-07-26 MED ORDER — GLUTARALDEHYDE 0.625% SOAKING SOLUTION
TOPICAL | Status: DC
  Filled 2020-07-26: qty 50

## 2020-07-26 MED ORDER — VANCOMYCIN HCL 1000 MG IV SOLR
INTRAVENOUS | Status: DC
  Filled 2020-07-26: qty 1000

## 2020-07-26 MED ORDER — TRANEXAMIC ACID 1000 MG/10ML IV SOLN
1.5000 mg/kg/h | INTRAVENOUS | Status: AC
  Administered 2020-07-27: 1.5 mg/kg/h via INTRAVENOUS
  Filled 2020-07-26: qty 25

## 2020-07-26 MED ORDER — NITROGLYCERIN IN D5W 200-5 MCG/ML-% IV SOLN
2.0000 ug/min | INTRAVENOUS | Status: AC
  Administered 2020-07-27: 16.6 ug/min via INTRAVENOUS
  Filled 2020-07-26: qty 250

## 2020-07-26 MED ORDER — EPINEPHRINE HCL 5 MG/250ML IV SOLN IN NS
0.0000 ug/min | INTRAVENOUS | Status: DC
  Filled 2020-07-26: qty 250

## 2020-07-26 MED ORDER — INSULIN REGULAR(HUMAN) IN NACL 100-0.9 UT/100ML-% IV SOLN
INTRAVENOUS | Status: AC
  Administered 2020-07-27: 2.4 [IU]/h via INTRAVENOUS
  Filled 2020-07-26: qty 100

## 2020-07-26 NOTE — Progress Notes (Signed)
Anesthesia Chart Review:  Case: [id number] Date/Time: 07/27/20 0715   Procedures:      AORTIC VALVE REPAIR (N/A Chest) - [facility] only     AORTIC VALVE REPLACEMENT (AVR) (N/A Chest)     TRANSESOPHAGEAL ECHOCARDIOGRAM (TEE) (N/A )   Anesthesia type: General   Pre-op diagnosis: AI   [facility]: [facility] ROOM 15 / [facility] OR   Surgeons: Purcell Nails, MD      DISCUSSION: Patient is a 44 year old male scheduled for the above procedure.  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 BMI is consistent with obesity. A1c result from 07/25/20 was 7.2%, suggestive of new DM2 diagnosis.  Staff message sent to Dr. Cornelius Moras and TCTS RN Alycia Rossetti regarding A1c. Unless other preoperative orders communicated, he will get a fasting CBG on arrival and would defer additional recommendations to Dr. Cornelius Moras during hospitalization.   He will need an ABG on arrival as unable to obtain specimen at PAT. presurgical COVID-19 test negative on 07/25/2020.  Anesthesia team to evaluate on the day of surgery.   VS: BP (!) 150/66   Pulse 81   Temp 36.6 C (Oral)   Resp 19   Ht 5\' 7"  (1.702 m)   Wt 107.5 kg   SpO2 96%   BMI 37.12 kg/m     PROVIDERS: , MD his PCP Tally Joe, DO is cardiologist   LABS: Labs reviewed: Acceptable for surgery. See DISCUSSION. (all labs ordered are listed, but only abnormal results are displayed)  Labs Reviewed  COMPREHENSIVE METABOLIC PANEL - Abnormal; Notable for the following components:      Result Value   Glucose, Bld 100 (*)    ALT 53 (*)    All other components within normal limits  HEMOGLOBIN A1C - Abnormal; Notable for the following components:   Hgb A1c MFr Bld 7.2 (*)    All other components within normal limits   URINALYSIS, ROUTINE W REFLEX MICROSCOPIC - Abnormal; Notable for the following components:   Hgb urine dipstick MODERATE (*)    Protein, ur 100 (*)    All other components within normal limits  SURGICAL PCR SCREEN  APTT  CBC  PROTIME-INR  TYPE AND SCREEN     IMAGES: CXR 07/25/20: FINDINGS: The heart size and mediastinal contours are within normal limits. No focal consolidation. No pulmonary edema. No pleural effusion. No pneumothorax. No acute osseous abnormality. Multilevel degenerative changes of the spine. IMPRESSION: No active cardiopulmonary disease.  CTA Chest/abd/pelvis 12/17/19: IMPRESSION: 1. Vascular findings and measurements pertinent to potential TAVR procedure, as detailed. Note made of mild narrowing of the aortic arch between the takeoffs of the left common carotid and left subclavian arteries. 2. Borderline mild cardiomegaly. 3. Diffuse bronchial wall thickening, nonspecific, as can be seen with reactive airways disease or chronic bronchitis. 4. Mosaic attenuation throughout both lungs, nonspecific, potentially due to mosaic perfusion from pulmonary vascular disease or air trapping from small airways disease. 5. Small to moderate hiatal hernia. 6. Mild sigmoid diverticulosis. 7. Moderate fat containing periumbilical hernia.   EKG: 07/25/20: Normal sinus rhythm Left axis deviation Left ventricular hypertrophy with QRS widening ( R in aVL , Cornell product ) Abnormal ECG No old tracing to compare Confirmed by 07/27/20 [phone]) on 07/25/2020 6:00:13 PM   CV: Carotid 07/27/2020 07/25/20:  Summary:  - Right Carotid: Velocities in the right ICA are consistent with a 1-39% stenosis.  - Left Carotid: Velocities in the left ICA are consistent with a 1-39% stenosis.  - Vertebrals: Right vertebral artery demonstrates antegrade flow. Left vertebral artery demonstrates systolic deceleration.  - Subclavians: Normal flow hemodynamics were seen in bilateral subclavian  arteries.    Echo (TTE) 05/31/20: IMPRESSIONS  1. Definity used; normal LV systolic function; grade 1 diastolic  dysfunction; mild LVE; mildly dilated aortic root; bicuspid aortic valve  with severe eccentic AI; elevated gradient in descending aorta noted on  previous echo not demonstrated on this  study.  2. Left ventricular ejection fraction, by estimation, is 55 to 60%. The  left ventricle has normal function. The left ventricle has no regional  wall motion abnormalities. The left ventricular internal cavity size was  mildly dilated. Left ventricular  diastolic parameters are consistent with Grade I diastolic dysfunction  (impaired relaxation). Elevated left atrial pressure.  3. Right ventricular systolic function is normal. The right ventricular  size is normal.  4. The mitral valve is normal in structure. Trivial mitral valve  regurgitation. No evidence of mitral stenosis.  5. The aortic valve is bicuspid. Aortic valve regurgitation is severe. No  aortic stenosis is present.  6. Aortic dilatation noted. There is mild dilatation of the aortic root  measuring 38 mm.  7. The inferior vena cava is normal in size with greater than 50%  respiratory variability, suggesting right atrial pressure of 3 mmHg.    CT Coronary 02/11/20: IMPRESSION: 1. Coronary calcium score of 0. 2. Normal coronary origin with left dominance. 3. Normal coronary arteries. 4. 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 5. Mid LAD myocardial bridge.   TEE 12/23/19: IMPRESSIONS  1. Left ventricular ejection fraction, by estimation, is 60 to 65%. The  left ventricle has normal function. The left ventricle has no regional  wall motion abnormalities. The left ventricular internal cavity size was  mildly dilated. Left ventricular  diastolic function could not be evaluated.  2. Right ventricular systolic function is normal. The right ventricular  size is  normal.  3. No left atrial/left atrial appendage thrombus was detected.  4. The mitral valve is normal in structure and function. Mild mitral  valve regurgitation.  5. The aortic valve is bicuspid. Aortic valve regurgitation is severe.  Mild aortic valve sclerosis is present, with no evidence of aortic valve  stenosis.  6. The aortic annulus measures 2.5 cm. The aortic root (3.5 cm) and  proximal ascending aorta (2.9 cm) are normal in caliber. The mid-arch is  relatively smaller in caliber at 1.7 cm. The distal aortic arch is ectatic  at the site of previous coarctation  repair and left subclavian takeoff with a maximum diameter of 3.4 cm,  while the descending aorta is relatively small at 1.9 cm. While no true  coarctation is seen, there is a 50% "step-down" in aortic caliber. No  atheroma is seen. Aortic dilatation noted.    Past Medical History:  Diagnosis Date  . Acid reflux   . Aortic insufficiency due to bicuspid aortic valve   . Bicuspid aortic valve   . Coarctation of the aorta, s/p repair   . Essential hypertension 10/12/2019  . GERD (gastroesophageal reflux disease)   . Heart murmur   . Hyperlipidemia   . Hypertension   . Mixed hyperlipidemia 10/12/2019  . Nonrheumatic aortic valve insufficiency 10/12/2019  . Obesity (  BMI 30-39.9)   . pulmonic valve stenosis s/p balloon valvuloplasty   . S/P repair of coarctation of aorta     Past Surgical History:  Procedure Laterality Date  . CARDIAC CATHETERIZATION  1988  . PERCUTANEOUS BALLOON VALVULOPLASTY     pulmonic valve  . status repair of coarctation of the aorta    . SUPRAVALVULAR AORTIC STENOSIS REPAIR  1977  . TEE WITHOUT CARDIOVERSION N/A 12/23/2019   Procedure: TRANSESOPHAGEAL ECHOCARDIOGRAM (TEE);  Surgeon: Thurmon Fair, MD;  Location: [facility] ENDOSCOPY;  Service: Cardiovascular;  Laterality: N/A;  . WISDOM TOOTH EXTRACTION      MEDICATIONS: . GARLIC PO  . lovastatin (MEVACOR) 40 MG tablet  . Misc Natural  Products (GINSENG COMPLEX PO)  . Multiple Vitamins-Minerals (MULTIVITAMIN WITH MINERALS) tablet  . omeprazole (PRILOSEC) 20 MG capsule  . OVER THE COUNTER MEDICATION  . valsartan-hydrochlorothiazide (DIOVAN-HCT) 160-25 MG tablet  . Zinc 50 MG CAPS   No current facility-administered medications for this encounter.   Melene Muller ON 07/27/2020] cefUROXime (ZINACEF) 1.5 g in sodium chloride 0.9 % 100 mL IVPB  . [START ON 07/27/2020] cefUROXime (ZINACEF) 750 mg in sodium chloride 0.9 % 100 mL IVPB  . [START ON 07/27/2020] dexmedetomidine (PRECEDEX) 400 MCG/100ML (4 mcg/mL) infusion  . [START ON 07/27/2020] EPINEPHrine (ADRENALIN) 4 mg in NS 250 mL (0.016 mg/mL) premix infusion  . [START ON 07/27/2020] glutaraldehyde 0.625% cardiac soaking solution  . [START ON 07/27/2020] heparin 30,000 units/NS 1000 mL solution for CELLSAVER  . [START ON 07/27/2020] heparin sodium (porcine) 2,500 Units, papaverine 30 mg in electrolyte-148 (PLASMALYTE-148) 500 mL irrigation  . [START ON 07/27/2020] insulin regular, human (MYXREDLIN) 100 units/ 100 mL infusion  . [START ON 07/27/2020] [place] Blood Cardioplegia vial (lidocaine/magnesium/mannitol 0.26g-4g-6.4g)  . [START ON 07/27/2020] milrinone (PRIMACOR) 20 MG/100 ML (0.2 mg/mL) infusion  . [START ON 07/27/2020] nitroGLYCERIN 50 mg in dextrose 5 % 250 mL (0.2 mg/mL) infusion  . [START ON 07/27/2020] norepinephrine (LEVOPHED) 4mg  in premix infusion  . [START ON 07/27/2020] phenylephrine (NEOSYNEPHRINE) 20-0.9 MG/250ML-% infusion  . [START ON 07/27/2020] potassium chloride injection 80 mEq  . [START ON 07/27/2020] tranexamic acid (CYKLOKAPRON) 2,500 mg in sodium chloride 0.9 % 250 mL (10 mg/mL) infusion  . [START ON 07/27/2020] tranexamic acid (CYKLOKAPRON) bolus via infusion - over 30 minutes 1,612.5 mg  . [START ON 07/27/2020] tranexamic acid (CYKLOKAPRON) pump prime solution 215 mg  . [START ON 07/27/2020] vancomycin (VANCOCIN) 1,000 mg in sodium chloride 0.9 % 1,000 mL  irrigation  . [START ON 07/27/2020] vancomycin (VANCOREADY) IVPB 1500 mg/300 mL    07/29/2020, PA-C Surgical Short Stay/Anesthesiology [facility] Phone [phone] [facility] Phone [phone] 07/26/2020 12:15 PM

## 2020-07-26 NOTE — Anesthesia Preprocedure Evaluation (Addendum)
Anesthesia Evaluation  Patient identified by MRN, date of birth, ID band Patient awake    Reviewed: Allergy & Precautions, H&P , NPO status , Patient's Chart, lab work & pertinent test results  Airway Mallampati: III  TM Distance: >3 FB Neck ROM: Full    Dental no notable dental hx. (+) Teeth Intact, Dental Advisory Given   Pulmonary neg pulmonary ROS, former smoker,    Pulmonary exam normal breath sounds clear to auscultation       Cardiovascular Exercise Tolerance: Good hypertension, Pt. on medications + Valvular Problems/Murmurs AI  Rhythm:Regular Rate:Normal     Neuro/Psych negative neurological ROS  negative psych ROS   GI/Hepatic Neg liver ROS, GERD  Medicated,  Endo/Other  negative endocrine ROS  Renal/GU negative Renal ROS  negative genitourinary   Musculoskeletal   Abdominal   Peds  Hematology negative hematology ROS (+)   Anesthesia Other Findings   Reproductive/Obstetrics negative OB ROS                           Anesthesia Physical Anesthesia Plan  ASA: IV  Anesthesia Plan: General   Post-op Pain Management:    Induction: Intravenous  PONV Risk Score and Plan: 2 and Ondansetron and Midazolam  Airway Management Planned: Oral ETT  Additional Equipment: Arterial line, CVP, PA Cath, TEE and Ultrasound Guidance Line Placement  Intra-op Plan:   Post-operative Plan: Post-operative intubation/ventilation  Informed Consent: I have reviewed the patients History and Physical, chart, labs and discussed the procedure including the risks, benefits and alternatives for the proposed anesthesia with the patient or authorized representative who has indicated his/her understanding and acceptance.     Dental advisory given  Plan Discussed with: CRNA  Anesthesia Plan Comments: (PAT note written 07/26/2020 by Shonna Chock, PA-C. )      Anesthesia Quick Evaluation

## 2020-07-26 NOTE — Telephone Encounter (Signed)
FM LA form completed and faxed to [phone]/ [employer]  Beginning leave 07/27/20, RTW date 10/24/20

## 2020-07-27 ENCOUNTER — Other Ambulatory Visit: Payer: Self-pay

## 2020-07-27 ENCOUNTER — Inpatient Hospital Stay (HOSPITAL_COMMUNITY): Payer: 59 | Admitting: Vascular Surgery

## 2020-07-27 ENCOUNTER — Inpatient Hospital Stay (HOSPITAL_COMMUNITY): Payer: 59

## 2020-07-27 ENCOUNTER — Encounter (HOSPITAL_COMMUNITY): Payer: Self-pay | Admitting: Thoracic Surgery (Cardiothoracic Vascular Surgery)

## 2020-07-27 ENCOUNTER — Inpatient Hospital Stay (HOSPITAL_COMMUNITY): Payer: 59 | Admitting: Anesthesiology

## 2020-07-27 ENCOUNTER — Encounter (HOSPITAL_COMMUNITY)
Admission: RE | Disposition: A | Discharge status: Home / Self Care | Payer: Self-pay | Source: Home / Self Care | Attending: Thoracic Surgery (Cardiothoracic Vascular Surgery)

## 2020-07-27 ENCOUNTER — Inpatient Hospital Stay (HOSPITAL_COMMUNITY)
Admission: RE | Admit: 2020-07-27 | Discharge: 2020-08-02 | DRG: 220 | Disposition: A | Discharge status: Home / Self Care | Payer: 59 | Attending: Thoracic Surgery (Cardiothoracic Vascular Surgery) | Admitting: Thoracic Surgery (Cardiothoracic Vascular Surgery)

## 2020-07-27 DIAGNOSIS — I97190 Other postprocedural cardiac functional disturbances following cardiac surgery: Secondary | ICD-10-CM | POA: Diagnosis not present

## 2020-07-27 DIAGNOSIS — Z09 Encounter for follow-up examination after completed treatment for conditions other than malignant neoplasm: Secondary | ICD-10-CM

## 2020-07-27 DIAGNOSIS — K219 Gastro-esophageal reflux disease without esophagitis: Secondary | ICD-10-CM | POA: Diagnosis present

## 2020-07-27 DIAGNOSIS — Z87891 Personal history of nicotine dependence: Secondary | ICD-10-CM

## 2020-07-27 DIAGNOSIS — I351 Nonrheumatic aortic (valve) insufficiency: Secondary | ICD-10-CM

## 2020-07-27 DIAGNOSIS — Z8261 Family history of arthritis: Secondary | ICD-10-CM

## 2020-07-27 DIAGNOSIS — I10 Essential (primary) hypertension: Secondary | ICD-10-CM | POA: Diagnosis present

## 2020-07-27 DIAGNOSIS — Q2381 Bicuspid aortic valve: Secondary | ICD-10-CM

## 2020-07-27 DIAGNOSIS — E669 Obesity, unspecified: Secondary | ICD-10-CM | POA: Diagnosis present

## 2020-07-27 DIAGNOSIS — Q231 Congenital insufficiency of aortic valve: Principal | ICD-10-CM

## 2020-07-27 DIAGNOSIS — Z8249 Family history of ischemic heart disease and other diseases of the circulatory system: Secondary | ICD-10-CM | POA: Diagnosis not present

## 2020-07-27 DIAGNOSIS — J9811 Atelectasis: Secondary | ICD-10-CM

## 2020-07-27 DIAGNOSIS — Z888 Allergy status to other drugs, medicaments and biological substances status: Secondary | ICD-10-CM

## 2020-07-27 DIAGNOSIS — Z833 Family history of diabetes mellitus: Secondary | ICD-10-CM

## 2020-07-27 DIAGNOSIS — Z79899 Other long term (current) drug therapy: Secondary | ICD-10-CM

## 2020-07-27 DIAGNOSIS — Z6837 Body mass index (BMI) 37.0-37.9, adult: Secondary | ICD-10-CM

## 2020-07-27 DIAGNOSIS — Z20822 Contact with and (suspected) exposure to covid-19: Secondary | ICD-10-CM | POA: Diagnosis present

## 2020-07-27 DIAGNOSIS — Z9889 Other specified postprocedural states: Secondary | ICD-10-CM

## 2020-07-27 DIAGNOSIS — G4700 Insomnia, unspecified: Secondary | ICD-10-CM | POA: Diagnosis not present

## 2020-07-27 DIAGNOSIS — E785 Hyperlipidemia, unspecified: Secondary | ICD-10-CM | POA: Diagnosis present

## 2020-07-27 HISTORY — PX: TEE WITHOUT CARDIOVERSION: SHX5443

## 2020-07-27 HISTORY — PX: AORTIC VALVE REPAIR: SHX6306

## 2020-07-27 HISTORY — DX: Other specified postprocedural states: Z98.890

## 2020-07-27 LAB — POCT I-STAT, CHEM 8
BUN: 17 mg/dL (ref 6–20)
BUN: 17 mg/dL (ref 6–20)
BUN: 17 mg/dL (ref 6–20)
BUN: 17 mg/dL (ref 6–20)
BUN: 18 mg/dL (ref 6–20)
BUN: 18 mg/dL (ref 6–20)
Calcium, Ion: 1.23 mmol/L (ref 1.15–1.40)
Calcium, Ion: 1.18 mmol/L (ref 1.15–1.40)
Calcium, Ion: 1.02 mmol/L — ABNORMAL LOW (ref 1.15–1.40)
Calcium, Ion: 1 mmol/L — ABNORMAL LOW (ref 1.15–1.40)
Calcium, Ion: 0.96 mmol/L — ABNORMAL LOW (ref 1.15–1.40)
Calcium, Ion: 1.01 mmol/L — ABNORMAL LOW (ref 1.15–1.40)
Chloride: 102 mmol/L (ref 98–111)
Chloride: 102 mmol/L (ref 98–111)
Chloride: 101 mmol/L (ref 98–111)
Chloride: 101 mmol/L (ref 98–111)
Chloride: 101 mmol/L (ref 98–111)
Chloride: 102 mmol/L (ref 98–111)
Creatinine, Ser: 0.8 mg/dL (ref 0.61–1.24)
Creatinine, Ser: 0.9 mg/dL (ref 0.61–1.24)
Creatinine, Ser: 0.9 mg/dL (ref 0.61–1.24)
Creatinine, Ser: 0.8 mg/dL (ref 0.61–1.24)
Creatinine, Ser: 0.8 mg/dL (ref 0.61–1.24)
Creatinine, Ser: 0.9 mg/dL (ref 0.61–1.24)
Glucose, Bld: 156 mg/dL — ABNORMAL HIGH (ref 70–99)
Glucose, Bld: 133 mg/dL — ABNORMAL HIGH (ref 70–99)
Glucose, Bld: 133 mg/dL — ABNORMAL HIGH (ref 70–99)
Glucose, Bld: 172 mg/dL — ABNORMAL HIGH (ref 70–99)
Glucose, Bld: 176 mg/dL — ABNORMAL HIGH (ref 70–99)
Glucose, Bld: 176 mg/dL — ABNORMAL HIGH (ref 70–99)
HCT: 41 % (ref 39.0–52.0)
HCT: 36 % — ABNORMAL LOW (ref 39.0–52.0)
HCT: 28 % — ABNORMAL LOW (ref 39.0–52.0)
HCT: 33 % — ABNORMAL LOW (ref 39.0–52.0)
HCT: 32 % — ABNORMAL LOW (ref 39.0–52.0)
HCT: 34 % — ABNORMAL LOW (ref 39.0–52.0)
Hemoglobin: 13.9 g/dL (ref 13.0–17.0)
Hemoglobin: 12.2 g/dL — ABNORMAL LOW (ref 13.0–17.0)
Hemoglobin: 9.5 g/dL — ABNORMAL LOW (ref 13.0–17.0)
Hemoglobin: 11.2 g/dL — ABNORMAL LOW (ref 13.0–17.0)
Hemoglobin: 10.9 g/dL — ABNORMAL LOW (ref 13.0–17.0)
Hemoglobin: 11.6 g/dL — ABNORMAL LOW (ref 13.0–17.0)
Potassium: 4.3 mmol/L (ref 3.5–5.1)
Potassium: 4.5 mmol/L (ref 3.5–5.1)
Potassium: 5.9 mmol/L — ABNORMAL HIGH (ref 3.5–5.1)
Potassium: 5.7 mmol/L — ABNORMAL HIGH (ref 3.5–5.1)
Potassium: 5.9 mmol/L — ABNORMAL HIGH (ref 3.5–5.1)
Potassium: 6 mmol/L — ABNORMAL HIGH (ref 3.5–5.1)
Sodium: 141 mmol/L (ref 135–145)
Sodium: 140 mmol/L (ref 135–145)
Sodium: 134 mmol/L — ABNORMAL LOW (ref 135–145)
Sodium: 135 mmol/L (ref 135–145)
Sodium: 137 mmol/L (ref 135–145)
Sodium: 137 mmol/L (ref 135–145)
TCO2: 26 mmol/L (ref 22–32)
TCO2: 28 mmol/L (ref 22–32)
TCO2: 27 mmol/L (ref 22–32)
TCO2: 27 mmol/L (ref 22–32)
TCO2: 25 mmol/L (ref 22–32)
TCO2: 26 mmol/L (ref 22–32)

## 2020-07-27 LAB — ECHO INTRAOPERATIVE TEE
AR max vel: 2.31 cm2
AV Area VTI: 2.36 cm2
AV Area mean vel: 1.92 cm2
AV Mean grad: 10 mmHg
AV Peak grad: 16.6 mmHg
Ao pk vel: 2.04 m/s
Height: 67 in
P 1/2 time: 465 msec
Weight: 3760 oz

## 2020-07-27 LAB — POCT I-STAT 7, (LYTES, BLD GAS, ICA,H+H)
Acid-Base Excess: 2 mmol/L (ref 0.0–2.0)
Acid-Base Excess: 4 mmol/L — ABNORMAL HIGH (ref 0.0–2.0)
Acid-Base Excess: 5 mmol/L — ABNORMAL HIGH (ref 0.0–2.0)
Acid-Base Excess: 5 mmol/L — ABNORMAL HIGH (ref 0.0–2.0)
Acid-base deficit: 1 mmol/L (ref 0.0–2.0)
Acid-base deficit: 2 mmol/L (ref 0.0–2.0)
Acid-base deficit: 2 mmol/L (ref 0.0–2.0)
Acid-base deficit: 1 mmol/L (ref 0.0–2.0)
Bicarbonate: 29.2 mmol/L — ABNORMAL HIGH (ref 20.0–28.0)
Bicarbonate: 29.5 mmol/L — ABNORMAL HIGH (ref 20.0–28.0)
Bicarbonate: 28.1 mmol/L — ABNORMAL HIGH (ref 20.0–28.0)
Bicarbonate: 29.4 mmol/L — ABNORMAL HIGH (ref 20.0–28.0)
Bicarbonate: 26.2 mmol/L (ref 20.0–28.0)
Bicarbonate: 24 mmol/L (ref 20.0–28.0)
Bicarbonate: 25.6 mmol/L (ref 20.0–28.0)
Bicarbonate: 25.9 mmol/L (ref 20.0–28.0)
Calcium, Ion: 1.21 mmol/L (ref 1.15–1.40)
Calcium, Ion: 0.96 mmol/L — ABNORMAL LOW (ref 1.15–1.40)
Calcium, Ion: 0.99 mmol/L — ABNORMAL LOW (ref 1.15–1.40)
Calcium, Ion: 0.99 mmol/L — ABNORMAL LOW (ref 1.15–1.40)
Calcium, Ion: 1.05 mmol/L — ABNORMAL LOW (ref 1.15–1.40)
Calcium, Ion: 1.05 mmol/L — ABNORMAL LOW (ref 1.15–1.40)
Calcium, Ion: 1.08 mmol/L — ABNORMAL LOW (ref 1.15–1.40)
Calcium, Ion: 1.03 mmol/L — ABNORMAL LOW (ref 1.15–1.40)
HCT: 38 % — ABNORMAL LOW (ref 39.0–52.0)
HCT: 31 % — ABNORMAL LOW (ref 39.0–52.0)
HCT: 33 % — ABNORMAL LOW (ref 39.0–52.0)
HCT: 34 % — ABNORMAL LOW (ref 39.0–52.0)
HCT: 34 % — ABNORMAL LOW (ref 39.0–52.0)
HCT: 32 % — ABNORMAL LOW (ref 39.0–52.0)
HCT: 33 % — ABNORMAL LOW (ref 39.0–52.0)
HCT: 30 % — ABNORMAL LOW (ref 39.0–52.0)
Hemoglobin: 12.9 g/dL — ABNORMAL LOW (ref 13.0–17.0)
Hemoglobin: 10.5 g/dL — ABNORMAL LOW (ref 13.0–17.0)
Hemoglobin: 11.2 g/dL — ABNORMAL LOW (ref 13.0–17.0)
Hemoglobin: 11.6 g/dL — ABNORMAL LOW (ref 13.0–17.0)
Hemoglobin: 11.6 g/dL — ABNORMAL LOW (ref 13.0–17.0)
Hemoglobin: 10.9 g/dL — ABNORMAL LOW (ref 13.0–17.0)
Hemoglobin: 11.2 g/dL — ABNORMAL LOW (ref 13.0–17.0)
Hemoglobin: 10.2 g/dL — ABNORMAL LOW (ref 13.0–17.0)
O2 Saturation: 99 %
O2 Saturation: 100 %
O2 Saturation: 100 %
O2 Saturation: 100 %
O2 Saturation: 94 %
O2 Saturation: 92 %
O2 Saturation: 92 %
O2 Saturation: 92 %
Patient temperature: 36.6
Patient temperature: 36.4
Patient temperature: 36.3
Patient temperature: 36
Potassium: 4.3 mmol/L (ref 3.5–5.1)
Potassium: 4.5 mmol/L (ref 3.5–5.1)
Potassium: 5.7 mmol/L — ABNORMAL HIGH (ref 3.5–5.1)
Potassium: 5.7 mmol/L — ABNORMAL HIGH (ref 3.5–5.1)
Potassium: 4.9 mmol/L (ref 3.5–5.1)
Potassium: 4.7 mmol/L (ref 3.5–5.1)
Potassium: 4.6 mmol/L (ref 3.5–5.1)
Potassium: 4.1 mmol/L (ref 3.5–5.1)
Sodium: 141 mmol/L (ref 135–145)
Sodium: 138 mmol/L (ref 135–145)
Sodium: 135 mmol/L (ref 135–145)
Sodium: 137 mmol/L (ref 135–145)
Sodium: 143 mmol/L (ref 135–145)
Sodium: 142 mmol/L (ref 135–145)
Sodium: 142 mmol/L (ref 135–145)
Sodium: 143 mmol/L (ref 135–145)
TCO2: 31 mmol/L (ref 22–32)
TCO2: 31 mmol/L (ref 22–32)
TCO2: 29 mmol/L (ref 22–32)
TCO2: 31 mmol/L (ref 22–32)
TCO2: 28 mmol/L (ref 22–32)
TCO2: 25 mmol/L (ref 22–32)
TCO2: 27 mmol/L (ref 22–32)
TCO2: 27 mmol/L (ref 22–32)
pCO2 arterial: 57 mmHg — ABNORMAL HIGH (ref 32.0–48.0)
pCO2 arterial: 45.7 mmHg (ref 32.0–48.0)
pCO2 arterial: 35.5 mmHg (ref 32.0–48.0)
pCO2 arterial: 41.1 mmHg (ref 32.0–48.0)
pCO2 arterial: 54.1 mmHg — ABNORMAL HIGH (ref 32.0–48.0)
pCO2 arterial: 43.3 mmHg (ref 32.0–48.0)
pCO2 arterial: 53.4 mmHg — ABNORMAL HIGH (ref 32.0–48.0)
pCO2 arterial: 50.5 mmHg — ABNORMAL HIGH (ref 32.0–48.0)
pH, Arterial: 7.317 — ABNORMAL LOW (ref 7.350–7.450)
pH, Arterial: 7.417 (ref 7.350–7.450)
pH, Arterial: 7.462 — ABNORMAL HIGH (ref 7.350–7.450)
pH, Arterial: 7.506 — ABNORMAL HIGH (ref 7.350–7.450)
pH, Arterial: 7.291 — ABNORMAL LOW (ref 7.350–7.450)
pH, Arterial: 7.35 (ref 7.350–7.450)
pH, Arterial: 7.285 — ABNORMAL LOW (ref 7.350–7.450)
pH, Arterial: 7.313 — ABNORMAL LOW (ref 7.350–7.450)
pO2, Arterial: 163 mmHg — ABNORMAL HIGH (ref 83.0–108.0)
pO2, Arterial: 284 mmHg — ABNORMAL HIGH (ref 83.0–108.0)
pO2, Arterial: 291 mmHg — ABNORMAL HIGH (ref 83.0–108.0)
pO2, Arterial: 318 mmHg — ABNORMAL HIGH (ref 83.0–108.0)
pO2, Arterial: 77 mmHg — ABNORMAL LOW (ref 83.0–108.0)
pO2, Arterial: 67 mmHg — ABNORMAL LOW (ref 83.0–108.0)
pO2, Arterial: 72 mmHg — ABNORMAL LOW (ref 83.0–108.0)
pO2, Arterial: 68 mmHg — ABNORMAL LOW (ref 83.0–108.0)

## 2020-07-27 LAB — BLOOD GAS, ARTERIAL
Acid-Base Excess: 4 mmol/L — ABNORMAL HIGH (ref 0.0–2.0)
Bicarbonate: 29 mmol/L — ABNORMAL HIGH (ref 20.0–28.0)
FIO2: 21
O2 Saturation: 98.5 %
Patient temperature: 37
pCO2 arterial: 51.8 mmHg — ABNORMAL HIGH (ref 32.0–48.0)
pH, Arterial: 7.366 (ref 7.350–7.450)
pO2, Arterial: 141 mmHg — ABNORMAL HIGH (ref 83.0–108.0)

## 2020-07-27 LAB — GLUCOSE, CAPILLARY
Glucose-Capillary: 126 mg/dL — ABNORMAL HIGH (ref 70–99)
Glucose-Capillary: 127 mg/dL — ABNORMAL HIGH (ref 70–99)
Glucose-Capillary: 130 mg/dL — ABNORMAL HIGH (ref 70–99)
Glucose-Capillary: 118 mg/dL — ABNORMAL HIGH (ref 70–99)
Glucose-Capillary: 149 mg/dL — ABNORMAL HIGH (ref 70–99)
Glucose-Capillary: 154 mg/dL — ABNORMAL HIGH (ref 70–99)
Glucose-Capillary: 155 mg/dL — ABNORMAL HIGH (ref 70–99)
Glucose-Capillary: 145 mg/dL — ABNORMAL HIGH (ref 70–99)
Glucose-Capillary: 140 mg/dL — ABNORMAL HIGH (ref 70–99)

## 2020-07-27 LAB — APTT: aPTT: 26 seconds (ref 24–36)

## 2020-07-27 LAB — CBC
HCT: 35.5 % — ABNORMAL LOW (ref 39.0–52.0)
HCT: 34.2 % — ABNORMAL LOW (ref 39.0–52.0)
Hemoglobin: 11.8 g/dL — ABNORMAL LOW (ref 13.0–17.0)
Hemoglobin: 11.3 g/dL — ABNORMAL LOW (ref 13.0–17.0)
MCH: 30.2 pg (ref 26.0–34.0)
MCH: 30.2 pg (ref 26.0–34.0)
MCHC: 33.2 g/dL (ref 30.0–36.0)
MCHC: 33 g/dL (ref 30.0–36.0)
MCV: 90.8 fL (ref 80.0–100.0)
MCV: 91.4 fL (ref 80.0–100.0)
Platelets: 198 10*3/uL (ref 150–400)
Platelets: 157 10*3/uL (ref 150–400)
RBC: 3.91 MIL/uL — ABNORMAL LOW (ref 4.22–5.81)
RBC: 3.74 MIL/uL — ABNORMAL LOW (ref 4.22–5.81)
RDW: 12.4 % (ref 11.5–15.5)
RDW: 12.6 % (ref 11.5–15.5)
WBC: 16.1 10*3/uL — ABNORMAL HIGH (ref 4.0–10.5)
WBC: 10.3 10*3/uL (ref 4.0–10.5)
nRBC: 0 % (ref 0.0–0.2)
nRBC: 0 % (ref 0.0–0.2)

## 2020-07-27 LAB — BASIC METABOLIC PANEL
Anion gap: 5 (ref 5–15)
BUN: 14 mg/dL (ref 6–20)
CO2: 25 mmol/L (ref 22–32)
Calcium: 7.2 mg/dL — ABNORMAL LOW (ref 8.9–10.3)
Chloride: 109 mmol/L (ref 98–111)
Creatinine, Ser: 0.86 mg/dL (ref 0.61–1.24)
GFR calc Af Amer: >60 mL/min (ref >60)
GFR calc non Af Amer: >60 mL/min (ref >60)
Glucose, Bld: 154 mg/dL — ABNORMAL HIGH (ref 70–99)
Potassium: 4.5 mmol/L (ref 3.5–5.1)
Sodium: 139 mmol/L (ref 135–145)

## 2020-07-27 LAB — PROTIME-INR
INR: 1.4 — ABNORMAL HIGH (ref 0.8–1.2)
Prothrombin Time: 16.5 seconds — ABNORMAL HIGH (ref 11.4–15.2)

## 2020-07-27 LAB — PLATELET COUNT: Platelets: 230 10*3/uL (ref 150–400)

## 2020-07-27 LAB — MAGNESIUM: Magnesium: 3.1 mg/dL — ABNORMAL HIGH (ref 1.7–2.4)

## 2020-07-27 LAB — ABO/RH: ABO/RH(D): O NEG

## 2020-07-27 LAB — HEMOGLOBIN AND HEMATOCRIT, BLOOD
HCT: 35.4 % — ABNORMAL LOW (ref 39.0–52.0)
Hemoglobin: 12 g/dL — ABNORMAL LOW (ref 13.0–17.0)

## 2020-07-27 SURGERY — REPAIR, AORTIC VALVE
Anesthesia: General | Site: Chest

## 2020-07-27 MED ORDER — SODIUM CHLORIDE 0.45 % IV SOLN: INTRAVENOUS | Status: DC | PRN

## 2020-07-27 MED ORDER — ASPIRIN EC 325 MG PO TBEC
325.0000 mg | DELAYED_RELEASE_TABLET | Freq: Every day | ORAL | Status: DC
  Administered 2020-07-28 – 2020-08-02 (×6): 325 mg via ORAL
  Filled 2020-07-27 (×6): qty 1

## 2020-07-27 MED ORDER — SODIUM CHLORIDE 0.9% FLUSH: 3.0000 mL | INTRAVENOUS | Status: DC | PRN

## 2020-07-27 MED ORDER — VANCOMYCIN HCL IN DEXTROSE 1-5 GM/200ML-% IV SOLN
1000.0000 mg | Freq: Once | INTRAVENOUS | Status: AC
  Administered 2020-07-27: 1000 mg via INTRAVENOUS
  Filled 2020-07-27: qty 200

## 2020-07-27 MED ORDER — FENTANYL CITRATE (PF) 250 MCG/5ML IJ SOLN
INTRAMUSCULAR | Status: DC | PRN
Start: 2020-07-27 — End: 2020-07-27
  Administered 2020-07-27: 50 ug via INTRAVENOUS
  Administered 2020-07-27: 100 ug via INTRAVENOUS
  Administered 2020-07-27: 600 ug via INTRAVENOUS
  Administered 2020-07-27: 50 ug via INTRAVENOUS
  Administered 2020-07-27 (×2): 150 ug via INTRAVENOUS
  Administered 2020-07-27: 100 ug via INTRAVENOUS
  Administered 2020-07-27: 150 ug via INTRAVENOUS

## 2020-07-27 MED ORDER — POTASSIUM CHLORIDE 10 MEQ/50ML IV SOLN: 10.0000 meq | INTRAVENOUS | Status: AC

## 2020-07-27 MED ORDER — PANTOPRAZOLE SODIUM 40 MG PO TBEC
40.0000 mg | DELAYED_RELEASE_TABLET | Freq: Every day | ORAL | Status: DC
  Administered 2020-07-29 – 2020-08-02 (×5): 40 mg via ORAL
  Filled 2020-07-27 (×5): qty 1

## 2020-07-27 MED ORDER — MAGNESIUM SULFATE 4 GM/100ML IV SOLN
INTRAVENOUS | Status: AC
  Filled 2020-07-27: qty 100

## 2020-07-27 MED ORDER — CHLORHEXIDINE GLUCONATE CLOTH 2 % EX PADS
6.0000 | MEDICATED_PAD | Freq: Every day | CUTANEOUS | Status: DC
  Administered 2020-07-27 – 2020-07-29 (×3): 6 via TOPICAL

## 2020-07-27 MED ORDER — LACTATED RINGERS IV SOLN: 500.0000 mL | Freq: Once | INTRAVENOUS | Status: DC | PRN

## 2020-07-27 MED ORDER — SODIUM CHLORIDE 0.9% FLUSH
3.0000 mL | Freq: Two times a day (BID) | INTRAVENOUS | Status: DC
  Administered 2020-07-28 – 2020-08-01 (×7): 3 mL via INTRAVENOUS

## 2020-07-27 MED ORDER — ALBUMIN HUMAN 5 % IV SOLN
INTRAVENOUS | Status: AC
  Administered 2020-07-27: 12.5 g
  Filled 2020-07-27: qty 250

## 2020-07-27 MED ORDER — ACETAMINOPHEN 160 MG/5ML PO SOLN: 650.0000 mg | Freq: Once | ORAL | Status: DC

## 2020-07-27 MED ORDER — LACTATED RINGERS IV SOLN: INTRAVENOUS | Status: DC

## 2020-07-27 MED ORDER — HEPARIN SODIUM (PORCINE) 1000 UNIT/ML IJ SOLN
INTRAMUSCULAR | Status: DC | PRN
  Administered 2020-07-27: 34000 [IU] via INTRAVENOUS

## 2020-07-27 MED ORDER — ARTIFICIAL TEARS OPHTHALMIC OINT
TOPICAL_OINTMENT | OPHTHALMIC | Status: DC | PRN
  Administered 2020-07-27: 1 via OPHTHALMIC

## 2020-07-27 MED ORDER — SODIUM CHLORIDE 0.9% FLUSH
10.0000 mL | Freq: Two times a day (BID) | INTRAVENOUS | Status: DC
  Administered 2020-07-27 – 2020-08-02 (×7): 10 mL

## 2020-07-27 MED ORDER — ROCURONIUM BROMIDE 10 MG/ML (PF) SYRINGE
PREFILLED_SYRINGE | INTRAVENOUS | Status: AC
  Filled 2020-07-27: qty 10

## 2020-07-27 MED ORDER — ACETAMINOPHEN 500 MG PO TABS
1000.0000 mg | ORAL_TABLET | Freq: Once | ORAL | Status: AC
  Administered 2020-07-27: 1000 mg via ORAL
  Filled 2020-07-27: qty 2

## 2020-07-27 MED ORDER — VANCOMYCIN HCL 1000 MG IV SOLR
INTRAVENOUS | Status: DC | PRN
  Administered 2020-07-27: 1000 mL

## 2020-07-27 MED ORDER — ROCURONIUM BROMIDE 10 MG/ML (PF) SYRINGE
PREFILLED_SYRINGE | INTRAVENOUS | Status: AC
  Filled 2020-07-27: qty 20

## 2020-07-27 MED ORDER — PHENYLEPHRINE 40 MCG/ML (10ML) SYRINGE FOR IV PUSH (FOR BLOOD PRESSURE SUPPORT)
PREFILLED_SYRINGE | INTRAVENOUS | Status: AC
  Filled 2020-07-27: qty 10

## 2020-07-27 MED ORDER — LACTATED RINGERS IV SOLN: INTRAVENOUS | Status: DC | PRN

## 2020-07-27 MED ORDER — PROTAMINE SULFATE 10 MG/ML IV SOLN
INTRAVENOUS | Status: DC | PRN
  Administered 2020-07-27: 300 mg via INTRAVENOUS

## 2020-07-27 MED ORDER — TRAMADOL HCL 50 MG PO TABS
50.0000 mg | ORAL_TABLET | ORAL | Status: DC | PRN
  Administered 2020-07-28: 50 mg via ORAL
  Administered 2020-07-28 – 2020-08-02 (×5): 100 mg via ORAL
  Filled 2020-07-27 (×5): qty 2
  Filled 2020-07-27: qty 1
  Filled 2020-07-27 (×2): qty 2

## 2020-07-27 MED ORDER — SODIUM CHLORIDE (PF) 0.9 % IJ SOLN
OROMUCOSAL | Status: DC | PRN
  Administered 2020-07-27 (×3): 4 mL via TOPICAL

## 2020-07-27 MED ORDER — MIDAZOLAM HCL 2 MG/2ML IJ SOLN: 2.0000 mg | INTRAMUSCULAR | Status: DC | PRN

## 2020-07-27 MED ORDER — NITROGLYCERIN IN D5W 200-5 MCG/ML-% IV SOLN: 0.0000 ug/min | INTRAVENOUS | Status: DC

## 2020-07-27 MED ORDER — MIDAZOLAM HCL 5 MG/5ML IJ SOLN
INTRAMUSCULAR | Status: DC | PRN
  Administered 2020-07-27 (×2): 2 mg via INTRAVENOUS
  Administered 2020-07-27: 1 mg via INTRAVENOUS
  Administered 2020-07-27 (×2): 2 mg via INTRAVENOUS
  Administered 2020-07-27: 1 mg via INTRAVENOUS

## 2020-07-27 MED ORDER — CHLORHEXIDINE GLUCONATE CLOTH 2 % EX PADS: 6.0000 | MEDICATED_PAD | Freq: Every day | CUTANEOUS | Status: DC

## 2020-07-27 MED ORDER — FENTANYL CITRATE (PF) 250 MCG/5ML IJ SOLN
INTRAMUSCULAR | Status: AC
  Filled 2020-07-27: qty 25

## 2020-07-27 MED ORDER — FAMOTIDINE IN NACL 20-0.9 MG/50ML-% IV SOLN: 20.0000 mg | Freq: Two times a day (BID) | INTRAVENOUS | Status: AC

## 2020-07-27 MED ORDER — SODIUM CHLORIDE 0.9% FLUSH: 10.0000 mL | INTRAVENOUS | Status: DC | PRN

## 2020-07-27 MED ORDER — PROPOFOL 10 MG/ML IV BOLUS
INTRAVENOUS | Status: DC | PRN
  Administered 2020-07-27: 50 mg via INTRAVENOUS

## 2020-07-27 MED ORDER — KETOROLAC TROMETHAMINE 15 MG/ML IJ SOLN
15.0000 mg | Freq: Four times a day (QID) | INTRAMUSCULAR | Status: DC | PRN
  Administered 2020-07-27 – 2020-07-28 (×3): 15 mg via INTRAVENOUS
  Filled 2020-07-27 (×2): qty 1

## 2020-07-27 MED ORDER — SODIUM CHLORIDE 0.9 % IV SOLN: INTRAVENOUS | Status: DC | PRN

## 2020-07-27 MED ORDER — ASPIRIN 81 MG PO CHEW: 324.0000 mg | CHEWABLE_TABLET | Freq: Every day | ORAL | Status: DC

## 2020-07-27 MED ORDER — PROPOFOL 10 MG/ML IV BOLUS
INTRAVENOUS | Status: AC
  Filled 2020-07-27: qty 20

## 2020-07-27 MED ORDER — BISACODYL 10 MG RE SUPP: 10.0000 mg | Freq: Every day | RECTAL | Status: DC

## 2020-07-27 MED ORDER — ACETAMINOPHEN 650 MG RE SUPP
650.0000 mg | Freq: Once | RECTAL | Status: DC
  Filled 2020-07-27: qty 1

## 2020-07-27 MED ORDER — CLEVIDIPINE BUTYRATE 0.5 MG/ML IV EMUL
INTRAVENOUS | Status: DC | PRN
  Administered 2020-07-27: 2 mg/h via INTRAVENOUS

## 2020-07-27 MED ORDER — PLASMA-LYTE 148 IV SOLN
INTRAVENOUS | Status: DC | PRN
  Administered 2020-07-27: 500 mL via INTRAVASCULAR

## 2020-07-27 MED ORDER — ACETAMINOPHEN 500 MG PO TABS
1000.0000 mg | ORAL_TABLET | Freq: Four times a day (QID) | ORAL | Status: DC
  Administered 2020-07-28 – 2020-08-01 (×19): 1000 mg via ORAL
  Filled 2020-07-27 (×20): qty 2

## 2020-07-27 MED ORDER — MIDAZOLAM HCL (PF) 10 MG/2ML IJ SOLN
INTRAMUSCULAR | Status: AC
  Filled 2020-07-27: qty 2

## 2020-07-27 MED ORDER — CHLORHEXIDINE GLUCONATE 0.12 % MT SOLN
15.0000 mL | OROMUCOSAL | Status: AC
  Administered 2020-07-27: 15 mL via OROMUCOSAL

## 2020-07-27 MED ORDER — METOPROLOL TARTRATE 12.5 MG HALF TABLET
12.5000 mg | ORAL_TABLET | Freq: Once | ORAL | Status: AC
  Administered 2020-07-27: 12.5 mg via ORAL
  Filled 2020-07-27: qty 1

## 2020-07-27 MED ORDER — DEXMEDETOMIDINE HCL IN NACL 400 MCG/100ML IV SOLN
0.0000 ug/kg/h | INTRAVENOUS | Status: DC
  Administered 2020-07-27: 0.5 ug/kg/h via INTRAVENOUS
  Filled 2020-07-27: qty 100

## 2020-07-27 MED ORDER — MAGNESIUM SULFATE 4 GM/100ML IV SOLN
4.0000 g | Freq: Once | INTRAVENOUS | Status: AC
  Administered 2020-07-27: 4 g via INTRAVENOUS

## 2020-07-27 MED ORDER — ACETAMINOPHEN 160 MG/5ML PO SOLN: 1000.0000 mg | Freq: Four times a day (QID) | ORAL | Status: DC

## 2020-07-27 MED ORDER — CHLORHEXIDINE GLUCONATE 0.12 % MT SOLN
15.0000 mL | Freq: Once | OROMUCOSAL | Status: AC
  Administered 2020-07-27: 15 mL via OROMUCOSAL
  Filled 2020-07-27: qty 15

## 2020-07-27 MED ORDER — KETOROLAC TROMETHAMINE 15 MG/ML IJ SOLN
INTRAMUSCULAR | Status: AC
  Administered 2020-07-28: 15 mg
  Filled 2020-07-27: qty 1

## 2020-07-27 MED ORDER — ALBUMIN HUMAN 5 % IV SOLN
250.0000 mL | INTRAVENOUS | Status: AC | PRN
  Administered 2020-07-27 (×4): 12.5 g via INTRAVENOUS
  Filled 2020-07-27 (×2): qty 250

## 2020-07-27 MED ORDER — FAMOTIDINE IN NACL 20-0.9 MG/50ML-% IV SOLN
INTRAVENOUS | Status: AC
  Administered 2020-07-27: 20 mg via INTRAVENOUS
  Filled 2020-07-27: qty 50

## 2020-07-27 MED ORDER — ROCURONIUM BROMIDE 10 MG/ML (PF) SYRINGE
PREFILLED_SYRINGE | INTRAVENOUS | Status: DC | PRN
  Administered 2020-07-27 (×2): 50 mg via INTRAVENOUS
  Administered 2020-07-27: 100 mg via INTRAVENOUS
  Administered 2020-07-27: 50 mg via INTRAVENOUS

## 2020-07-27 MED ORDER — SODIUM CHLORIDE 0.9 % IR SOLN
Status: DC | PRN
  Administered 2020-07-27: 5000 mL

## 2020-07-27 MED ORDER — SODIUM CHLORIDE 0.9 % IV SOLN: INTRAVENOUS | Status: AC

## 2020-07-27 MED ORDER — FENTANYL CITRATE (PF) 250 MCG/5ML IJ SOLN
INTRAMUSCULAR | Status: AC
  Filled 2020-07-27: qty 5

## 2020-07-27 MED ORDER — PROTAMINE SULFATE 10 MG/ML IV SOLN
INTRAVENOUS | Status: AC
  Filled 2020-07-27: qty 5

## 2020-07-27 MED ORDER — HEPARIN SODIUM (PORCINE) 1000 UNIT/ML IJ SOLN
INTRAMUSCULAR | Status: AC
  Filled 2020-07-27: qty 1

## 2020-07-27 MED ORDER — CALCIUM CHLORIDE 10 % IV SOLN
INTRAVENOUS | Status: DC | PRN
  Administered 2020-07-27: 200 mg via INTRAVENOUS

## 2020-07-27 MED ORDER — PROTAMINE SULFATE 10 MG/ML IV SOLN
INTRAVENOUS | Status: AC
  Filled 2020-07-27: qty 25

## 2020-07-27 MED ORDER — ONDANSETRON HCL 4 MG/2ML IJ SOLN
4.0000 mg | Freq: Four times a day (QID) | INTRAMUSCULAR | Status: DC | PRN
  Administered 2020-07-28 (×2): 4 mg via INTRAVENOUS
  Filled 2020-07-27 (×2): qty 2

## 2020-07-27 MED ORDER — BISACODYL 5 MG PO TBEC
10.0000 mg | DELAYED_RELEASE_TABLET | Freq: Every day | ORAL | Status: DC
  Administered 2020-07-28 – 2020-08-01 (×3): 10 mg via ORAL
  Filled 2020-07-27 (×5): qty 2

## 2020-07-27 MED ORDER — OXYCODONE HCL 5 MG PO TABS
5.0000 mg | ORAL_TABLET | ORAL | Status: DC | PRN
  Administered 2020-07-27 – 2020-07-31 (×7): 10 mg via ORAL
  Filled 2020-07-27 (×7): qty 2

## 2020-07-27 MED ORDER — INSULIN REGULAR(HUMAN) IN NACL 100-0.9 UT/100ML-% IV SOLN: INTRAVENOUS | Status: DC

## 2020-07-27 MED ORDER — ALBUMIN HUMAN 5 % IV SOLN: INTRAVENOUS | Status: DC | PRN

## 2020-07-27 MED ORDER — SODIUM CHLORIDE 0.9 % IV SOLN
1.5000 g | Freq: Two times a day (BID) | INTRAVENOUS | Status: AC
  Administered 2020-07-27 – 2020-07-29 (×4): 1.5 g via INTRAVENOUS
  Filled 2020-07-27 (×4): qty 1.5

## 2020-07-27 MED ORDER — DEXTROSE 50 % IV SOLN: 0.0000 mL | INTRAVENOUS | Status: DC | PRN

## 2020-07-27 MED ORDER — MORPHINE SULFATE (PF) 2 MG/ML IV SOLN
1.0000 mg | INTRAVENOUS | Status: DC | PRN
  Administered 2020-07-27 – 2020-07-28 (×2): 4 mg via INTRAVENOUS
  Administered 2020-07-29: 2 mg via INTRAVENOUS
  Filled 2020-07-27 (×3): qty 2
  Filled 2020-07-27: qty 1

## 2020-07-27 MED ORDER — SODIUM CHLORIDE 0.9 % IV SOLN: 250.0000 mL | INTRAVENOUS | Status: DC

## 2020-07-27 MED ORDER — SODIUM CHLORIDE 0.9 % IV SOLN: INTRAVENOUS | Status: DC

## 2020-07-27 MED ORDER — PHENYLEPHRINE HCL-NACL 20-0.9 MG/250ML-% IV SOLN: 0.0000 ug/min | INTRAVENOUS | Status: DC

## 2020-07-27 MED ORDER — ONDANSETRON HCL 4 MG/2ML IJ SOLN
INTRAMUSCULAR | Status: DC | PRN
  Administered 2020-07-27: 4 mg via INTRAVENOUS

## 2020-07-27 MED ORDER — PHENYLEPHRINE 40 MCG/ML (10ML) SYRINGE FOR IV PUSH (FOR BLOOD PRESSURE SUPPORT)
PREFILLED_SYRINGE | INTRAVENOUS | Status: DC | PRN
  Administered 2020-07-27: 120 ug via INTRAVENOUS

## 2020-07-27 MED ORDER — ARTIFICIAL TEARS OPHTHALMIC OINT
TOPICAL_OINTMENT | OPHTHALMIC | Status: AC
  Filled 2020-07-27: qty 3.5

## 2020-07-27 MED ORDER — DOCUSATE SODIUM 100 MG PO CAPS
200.0000 mg | ORAL_CAPSULE | Freq: Every day | ORAL | Status: DC
  Administered 2020-07-28 – 2020-08-02 (×6): 200 mg via ORAL
  Filled 2020-07-27 (×6): qty 2

## 2020-07-27 MED ORDER — ONDANSETRON HCL 4 MG/2ML IJ SOLN
INTRAMUSCULAR | Status: AC
  Filled 2020-07-27: qty 2

## 2020-07-27 MED ORDER — CHLORHEXIDINE GLUCONATE 4 % EX LIQD: 30.0000 mL | CUTANEOUS | Status: DC

## 2020-07-27 SURGICAL SUPPLY — 104 items
ADAPTER CARDIO PERF ANTE/RETRO (ADAPTER) ×3 IMPLANT
BLADE CLIPPER SURG (BLADE) ×3 IMPLANT
BLADE NEEDLE 3 SS STRL (BLADE) ×3 IMPLANT
BLADE STERNUM SYSTEM 6 (BLADE) ×3 IMPLANT
BLADE SURG 11 STRL SS (BLADE) ×3 IMPLANT
CANISTER SUCT 3000ML PPV (MISCELLANEOUS) ×3 IMPLANT
CANNULA AORTIC ROOT 9FR (CANNULA) ×3 IMPLANT
CANNULA EZ GLIDE AORTIC 21FR (CANNULA) ×3 IMPLANT
CANNULA GUNDRY RCSP 15FR (MISCELLANEOUS) ×3 IMPLANT
CATH CPB KIT OWEN (MISCELLANEOUS) ×3 IMPLANT
CATH HEART VENT LEFT (CATHETERS) ×2 IMPLANT
CATH THORACIC 36FR RT ANG (CATHETERS) ×3 IMPLANT
COVER BACK TABLE 60X90IN (DRAPES) ×3 IMPLANT
COVER SURGICAL LIGHT HANDLE (MISCELLANEOUS) ×3 IMPLANT
DRAIN CHANNEL 32F RND 10.7 FF (WOUND CARE) ×3 IMPLANT
DRAPE CARDIOVASCULAR INCISE (DRAPES) ×1
DRAPE CV SPLIT W-CLR ANES SCRN (DRAPES) ×3 IMPLANT
DRAPE HALF SHEET 40X57 (DRAPES) ×3 IMPLANT
DRAPE INCISE IOBAN 66X45 STRL (DRAPES) ×6 IMPLANT
DRAPE PERI GROIN 82X75IN TIB (DRAPES) IMPLANT
DRAPE SLUSH/WARMER DISC (DRAPES) ×3 IMPLANT
DRAPE SRG 135X102X78XABS (DRAPES) ×2 IMPLANT
DRSG AQUACEL AG ADV 3.5X14 (GAUZE/BANDAGES/DRESSINGS) ×3 IMPLANT
ELECT REM PT RETURN 9FT ADLT (ELECTROSURGICAL) ×6
ELECTRODE REM PT RTRN 9FT ADLT (ELECTROSURGICAL) ×4 IMPLANT
FELT TEFLON 1X6 (MISCELLANEOUS) ×6 IMPLANT
FIBERTAPE STERNAL CLSR 2 36IN (SUTURE) ×12 IMPLANT
FIBERTAPE STERNAL CLSR 2X36 (SUTURE) ×12 IMPLANT
GAUZE SPONGE 4X4 12PLY STRL (GAUZE/BANDAGES/DRESSINGS) ×3 IMPLANT
GLOVE BIO SURGEON STRL SZ 6 (GLOVE) ×9 IMPLANT
GLOVE BIO SURGEON STRL SZ 6.5 (GLOVE) ×15 IMPLANT
GLOVE BIO SURGEON STRL SZ7 (GLOVE) IMPLANT
GLOVE BIO SURGEON STRL SZ7.5 (GLOVE) ×3 IMPLANT
GLOVE BIOGEL PI IND STRL 7.0 (GLOVE) ×2 IMPLANT
GLOVE BIOGEL PI IND STRL 7.5 (GLOVE) ×4 IMPLANT
GLOVE BIOGEL PI IND STRL 8.5 (GLOVE) ×2 IMPLANT
GLOVE BIOGEL PI INDICATOR 7.0 (GLOVE) ×1
GLOVE BIOGEL PI INDICATOR 7.5 (GLOVE) ×2
GLOVE BIOGEL PI INDICATOR 8.5 (GLOVE) ×1
GLOVE ORTHO TXT STRL SZ7.5 (GLOVE) ×6 IMPLANT
GLOVE TRIUMPH SURG SIZE 7.0 (KITS) ×3 IMPLANT
GOWN STRL REUS W/ TWL LRG LVL3 (GOWN DISPOSABLE) ×16 IMPLANT
GOWN STRL REUS W/TWL LRG LVL3 (GOWN DISPOSABLE) ×8
HEMOSTAT POWDER SURGIFOAM 1G (HEMOSTASIS) ×9 IMPLANT
INSERT FOGARTY XLG (MISCELLANEOUS) ×3 IMPLANT
KIT BASIN OR (CUSTOM PROCEDURE TRAY) ×3 IMPLANT
KIT DRAINAGE VACCUM ASSIST (KITS) ×3 IMPLANT
KIT SUCTION CATH 14FR (SUCTIONS) ×12 IMPLANT
KIT TURNOVER KIT B (KITS) ×3 IMPLANT
LEAD PACING MYOCARDI (MISCELLANEOUS) ×3 IMPLANT
LINE VENT (MISCELLANEOUS) ×3 IMPLANT
NDL SUT PASSING CERCLAGE MED (SUTURE) ×6
NEEDLE SUT PASSING CERCLAG MED (SUTURE) ×4 IMPLANT
NS IRRIG 1000ML POUR BTL (IV SOLUTION) ×15 IMPLANT
PACK E OPEN HEART (SUTURE) ×3 IMPLANT
PACK OPEN HEART (CUSTOM PROCEDURE TRAY) ×3 IMPLANT
PAD ARMBOARD 7.5X6 YLW CONV (MISCELLANEOUS) ×6 IMPLANT
PLEDGET HAART (MISCELLANEOUS) ×3 IMPLANT
POSITIONER HEAD DONUT 9IN (MISCELLANEOUS) ×3 IMPLANT
RING ANLPLS HAART 200X23 (Ring) ×2 IMPLANT
RING HAART ANNULOPLASTY 200-23 (Ring) ×1 IMPLANT
SET CARDIOPLEGIA MPS 5001102 (MISCELLANEOUS) ×3 IMPLANT
SET CARTRIDGE AND TUBING (SET/KITS/TRAYS/PACK) ×3 IMPLANT
SET IRRIG TUBING LAPAROSCOPIC (IRRIGATION / IRRIGATOR) IMPLANT
SPONGE LAP 4X18 RFD (DISPOSABLE) ×3 IMPLANT
SUT BONE WAX W31G (SUTURE) ×3 IMPLANT
SUT ETHIBON 2 0 V 52N 30 (SUTURE) ×6 IMPLANT
SUT ETHIBON EXCEL 2-0 V-5 (SUTURE) IMPLANT
SUT ETHIBOND 2 0 SH (SUTURE)
SUT ETHIBOND 2 0 SH 36X2 (SUTURE) IMPLANT
SUT ETHIBOND 2 0 V4 (SUTURE) IMPLANT
SUT ETHIBOND 2 0V4 GREEN (SUTURE) IMPLANT
SUT ETHIBOND 4 0 RB 1 (SUTURE) ×6 IMPLANT
SUT ETHIBOND V-5 VALVE (SUTURE) IMPLANT
SUT ETHIBOND X763 2 0 SH 1 (SUTURE) ×18 IMPLANT
SUT MNCRL AB 3-0 PS2 18 (SUTURE) ×6 IMPLANT
SUT PDS AB 1 CTX 36 (SUTURE) ×6 IMPLANT
SUT PROLENE 3 0 SH DA (SUTURE) ×3 IMPLANT
SUT PROLENE 3 0 SH1 36 (SUTURE) ×12 IMPLANT
SUT PROLENE 4 0 RB 1 (SUTURE) ×14
SUT PROLENE 4 0 SH DA (SUTURE) ×6 IMPLANT
SUT PROLENE 4-0 RB1 .5 CRCL 36 (SUTURE) ×28 IMPLANT
SUT PROLENE 5 0 C 1 36 (SUTURE) ×6 IMPLANT
SUT PROLENE 6 0 C 1 30 (SUTURE) ×15 IMPLANT
SUT PROLENE 7 0 BV 1 (SUTURE) ×3 IMPLANT
SUT SILK  1 MH (SUTURE) ×1
SUT SILK 1 MH (SUTURE) ×2 IMPLANT
SUT SILK 2 0 SH CR/8 (SUTURE) ×3 IMPLANT
SUT SILK 3 0 SH CR/8 (SUTURE) IMPLANT
SUT STEEL 6MS V (SUTURE) IMPLANT
SUT VIC AB 2-0 CTX 27 (SUTURE) IMPLANT
SYR BULB IRRIG 60ML STRL (SYRINGE) ×6 IMPLANT
SYSTEM SAHARA CHEST DRAIN ATS (WOUND CARE) ×3 IMPLANT
TAPE CLOTH SURG 4X10 WHT LF (GAUZE/BANDAGES/DRESSINGS) ×3 IMPLANT
TAPE PAPER 2X10 WHT MICROPORE (GAUZE/BANDAGES/DRESSINGS) ×3 IMPLANT
TIP SHEAR CVD EXTENDED 36KH (INSTRUMENTS) ×3 IMPLANT
TOWEL GREEN STERILE (TOWEL DISPOSABLE) ×3 IMPLANT
TOWEL GREEN STERILE FF (TOWEL DISPOSABLE) ×3 IMPLANT
TRAY FOLEY SLVR 16FR TEMP STAT (SET/KITS/TRAYS/PACK) ×3 IMPLANT
TUBE SUCT INTRACARD DLP 20F (MISCELLANEOUS) ×3 IMPLANT
UNDERPAD 30X36 HEAVY ABSORB (UNDERPADS AND DIAPERS) ×3 IMPLANT
VENT LEFT HEART 12002 (CATHETERS) ×3
WATER STERILE IRR 1000ML POUR (IV SOLUTION) ×6 IMPLANT
WRENCH TORQUE 36KHZ (INSTRUMENTS) ×3 IMPLANT

## 2020-07-27 NOTE — Progress Notes (Signed)
Patient ID: Dean Molina, male   DOB: 03-Mar-1976, 44 y.o.   MRN: [id number] EVENING ROUNDS NOTE :     [address]       [name] [address]             [phone]                 Day of Surgery Procedure(s) (LRB): AORTIC VALVE REPAIR USING HAART 200 AORTIC ANNULOPLASTY DEVICE SIZE (N/A) TRANSESOPHAGEAL ECHOCARDIOGRAM (TEE) (N/A)  Total Length of Stay:  LOS: 0 days  BP 91/70   Pulse 80   Temp 97.9 F (36.6 C)   Resp 16   Ht 5\' 7"  (1.702 m)   Wt 106.6 kg   SpO2 95%   BMI 36.81 kg/m   .Intake/Output      09/21 0701 - 09/22 0700 09/22 0701 - 09/23 0700   I.V. (mL/kg)  2249.7 (21.1)   Blood  486   IV Piggyback  831.5   Total Intake(mL/kg)  3567.1 (33.5)   Urine (mL/kg/hr)  2150 (2)   Blood  800   Chest Tube  65   Total Output  3015   Net  +552.1          . sodium chloride 20 mL/hr at 07/27/20 1600  . [START ON 07/28/2020] sodium chloride    . sodium chloride 20 mL/hr at 07/27/20 1330  . sodium chloride 100 mL/hr at 07/27/20 1600  . cefUROXime (ZINACEF)  IV    . dexmedetomidine (PRECEDEX) IV infusion 0.5 mcg/kg/hr (07/27/20 1600)  . famotidine (PEPCID) IV Stopped (07/27/20 1409)  . insulin 1.5 mL/hr at 07/27/20 1600  . lactated ringers    . lactated ringers    . lactated ringers 20 mL/hr at 07/27/20 1600  . magnesium sulfate 20 mL/hr at 07/27/20 1600  . nitroGLYCERIN Stopped (07/27/20 1334)  . phenylephrine (NEO-SYNEPHRINE) Adult infusion Stopped (07/27/20 1516)  . vancomycin       Lab Results  Component Value Date   WBC 16.1 (H) 07/27/2020   HGB 11.8 (L) 07/27/2020   HCT 35.5 (L) 07/27/2020   PLT 198 07/27/2020   GLUCOSE 176 (H) 07/27/2020   ALT 53 (H) 07/25/2020   AST 36 07/25/2020   NA 137 07/27/2020   K 5.9 (H) 07/27/2020   CL 102 07/27/2020   CREATININE 0.90 07/27/2020   BUN 18 07/27/2020   CO2 30 07/25/2020   INR 1.4 (H) 07/27/2020   HGBA1C 7.2 (H) 07/25/2020   Early post op abg\ hypoxic and hypercarbic   Not bleeding   Room air abg preop pco2 51 Wait until more alert and then try to extubate - glide scope for intubation  07/27/2020 MD  Beeper [phone] Office [phone] 07/27/2020 5:15 PM

## 2020-07-27 NOTE — Procedures (Signed)
Extubation Procedure Note  Patient Details:   Name: Dean Molina DOB: 01-25-1976 MRN: [id number]   Airway Documentation:    Vent end date: 07/27/20 Vent end time: 1820   Evaluation  O2 sats: stable throughout Complications: No apparent complications Patient did tolerate procedure well. Bilateral Breath Sounds: Clear, Diminished   Yes, pt could speak post extubation.  Audrie Lia 07/27/2020, 6:22 PM

## 2020-07-27 NOTE — Brief Op Note (Signed)
07/27/2020  1:06 PM  PATIENT:  Dean Molina  44 y.o. male  PRE-OPERATIVE DIAGNOSIS:  Aortic Insufficiency  POST-OPERATIVE DIAGNOSIS:  Aortic Insufficiency  PROCEDURE:  Procedure(s) with comments: AORTIC VALVE REPAIR USING HAART 200 AORTIC ANNULOPLASTY DEVICE SIZE (N/A) - [facility] only TRANSESOPHAGEAL ECHOCARDIOGRAM (TEE) (N/A)  SURGEON:  Surgeon(s) and Role:    * Purcell Nails, MD - Primary    * Bartle, Payton Doughty, MD - Assisting  PHYSICIAN ASSISTANT: WAYNE GOLD PA-C  ASSISTANTS: STAFF   ANESTHESIA:   general  EBL:  800 mL   BLOOD ADMINISTERED:none  DRAINS: MEDIASTINAL CHEST DRAINS   LOCAL MEDICATIONS USED:  NONE  SPECIMEN:  No Specimen  DISPOSITION OF SPECIMEN:  N/A  COUNTS:  YES  TOURNIQUET:  * No tourniquets in log *  DICTATION: .Dragon Dictation  PLAN OF CARE: Admit to inpatient   PATIENT DISPOSITION:  ICU - intubated and hemodynamically stable.   Delay start of Pharmacological VTE agent (>24hrs) due to surgical blood loss or risk of bleeding: yes  COMPLICATIONS: NO KNOWN

## 2020-07-27 NOTE — Progress Notes (Signed)
Pt extubated to nasal cannula - 3L.

## 2020-07-27 NOTE — Interval H&P Note (Signed)
History and Physical Interval Note:  07/27/2020 5:57 AM  Dean Molina  has presented today for surgery, with the diagnosis of AI.  The various methods of treatment have been discussed with the patient and family. After consideration of risks, benefits and other options for treatment, the patient has consented to  Procedure(s) with comments: AORTIC VALVE REPAIR (N/A) - [facility] only AORTIC VALVE REPLACEMENT (AVR) (N/A) TRANSESOPHAGEAL ECHOCARDIOGRAM (TEE) (N/A) as a surgical intervention.  The patient's history has been reviewed, patient examined, no change in status, stable for surgery.  I have reviewed the patient's chart and labs.  Questions were answered to the patient's satisfaction.     Purcell Nails

## 2020-07-27 NOTE — Progress Notes (Signed)
Pt's oxygen sat. Was 91-92% on arrival to unit.  Pt had been recruited prior to leaving the OR.  Initial ABG was obtained and another recruitment was performed with an increase of the rate to 20 on the vent.  The follow-up ABG 45 minutes later was WNL.

## 2020-07-27 NOTE — Transfer of Care (Signed)
Immediate Anesthesia Transfer of Care Note  Patient: Dean Molina  Procedure(s) Performed: AORTIC VALVE REPAIR USING HAART 200 AORTIC ANNULOPLASTY DEVICE SIZE (N/A Chest) TRANSESOPHAGEAL ECHOCARDIOGRAM (TEE) (N/A )  Patient Location: ICU  Anesthesia Type:General  Level of Consciousness: sedated and Patient remains intubated per anesthesia plan  Airway & Oxygen Therapy: Patient remains intubated per anesthesia plan and Patient placed on Ventilator (see vital sign flow sheet for setting)  Post-op Assessment: Report given to RN and Post -op Vital signs reviewed and stable  Post vital signs: Reviewed and stable  Last Vitals:  Vitals Value Taken Time  BP 91/70 07/27/20 1333  Temp 36.9 C 07/27/20 1339  Pulse 84 07/27/20 1339  Resp 12 07/27/20 1339  SpO2 92 % 07/27/20 1339  Vitals shown include unvalidated device data.  Last Pain:  Vitals:   07/27/20 0652  TempSrc: Temporal  PainSc:       Patients Stated Pain Goal: 5 (07/27/20 0629)  Complications: No complications documented.

## 2020-07-27 NOTE — Anesthesia Procedure Notes (Signed)
Procedure Name: Intubation Date/Time: 07/27/2020 7:54 AM Performed by: Jed Limerick, CRNA Pre-anesthesia Checklist: Patient identified, Emergency Drugs available, Suction available and Patient being monitored Patient Re-evaluated:Patient Re-evaluated prior to induction Oxygen Delivery Method: Circle System Utilized Preoxygenation: Pre-oxygenation with 100% oxygen Induction Type: IV induction Ventilation: Two handed mask ventilation required and Oral airway inserted - appropriate to patient size Laryngoscope Size: Glidescope and 4 Grade View: Grade I Tube type: Oral Tube size: 8.0 mm Number of attempts: 1 Airway Equipment and Method: Oral airway,  Video-laryngoscopy and Rigid stylet Placement Confirmation: ETT inserted through vocal cords under direct vision,  positive ETCO2 and breath sounds checked- equal and bilateral Secured at: 23 cm Tube secured with: Tape Dental Injury: Teeth and Oropharynx as per pre-operative assessment

## 2020-07-27 NOTE — Anesthesia Procedure Notes (Signed)
Arterial Line Insertion Start/End9/22/2021 7:00 AM Performed by: Jed Limerick, CRNA, CRNA  Patient location: Pre-op. Preanesthetic checklist: patient identified, IV checked, site marked, risks and benefits discussed, surgical consent, monitors and equipment checked, pre-op evaluation, timeout performed and anesthesia consent Lidocaine 1% used for infiltration Left, radial was placed Catheter size: 20 G Hand hygiene performed  and maximum sterile barriers used   Attempts: 1 Procedure performed without using ultrasound guided technique. Following insertion, dressing applied and Biopatch. Post procedure assessment: normal and unchanged  Patient tolerated the procedure well with no immediate complications.

## 2020-07-27 NOTE — Progress Notes (Signed)
Pt was had been following commands and responding appropriately.  Rapid wen protocol was initiated at 1620.  ABG following wean was not within acceptable parameters.  Pt's rate was increase to 20 and another ABG will be checked at 1800.

## 2020-07-27 NOTE — Anesthesia Postprocedure Evaluation (Signed)
Anesthesia Post Note  Patient: Dean Molina  Procedure(s) Performed: AORTIC VALVE REPAIR USING HAART 200 AORTIC ANNULOPLASTY DEVICE SIZE (N/A Chest) TRANSESOPHAGEAL ECHOCARDIOGRAM (TEE) (N/A )     Patient location during evaluation: SICU Anesthesia Type: General Level of consciousness: sedated Pain management: pain level controlled Vital Signs Assessment: post-procedure vital signs reviewed and stable Respiratory status: patient remains intubated per anesthesia plan Cardiovascular status: stable Postop Assessment: no apparent nausea or vomiting Anesthetic complications: no   No complications documented.  Last Vitals:  Vitals:   07/27/20 0723 07/27/20 1335  BP:    Pulse: 85 85  Resp: 19 12  Temp:    SpO2: 96% 92%    Last Pain:  Vitals:   07/27/20 0652  TempSrc: Temporal  PainSc:                  Dean Molina,W. EDMOND

## 2020-07-27 NOTE — Op Note (Signed)
CARDIOTHORACIC SURGERY OPERATIVE NOTE  Date of Procedure:  07/27/2020  Preoperative Diagnosis:   Sievers Type I Bicuspid Aortic Valve  Severe Aortic Insufficiency  Postoperative Diagnosis: Same   Procedure:    Aortic Valve Repair   Complex valvuloplasty including release of single raphe  Free edge plication repair of bileaflet prolapse             Biostable HAART aortic ring annuloplasty (size 23 mm, ref # [id number], lot # [id number])    Surgeon: Salvatore Decent. Cornelius Moras, MD  Assistant: Alleen Borne, MD and Rowe Clack, PA-C  Anesthesia: Gaynelle Adu, MD  Operative Findings:  Ileene Rubens type I bicuspid aortic valve with single raphe and left-right fusion  Bileaflet prolapse with severe aortic insufficiency  Normal left ventricular systolic function  No residual aortic insufficiency after successful valve repair                 BRIEF CLINICAL NOTE AND INDICATIONS FOR SURGERY  Patient is a 44 year old moderately obese male with history of congenital heart disease,hypertension, hyperlipidemia, and GE reflux disease whoreturns to the office today for further discuss treatment options for management of bicuspid aortic valve with severe primary aortic insufficiency.The patient has been seen in consultation and counseled at length regarding the indications, risks and potential benefits of surgery.  All questions have been answered, and the patient provides full informed consent for the operation as described.    DETAILS OF THE OPERATIVE PROCEDURE  Preparation:  The patient is brought to the operating room on the above mentioned date and central monitoring was established by the anesthesia team including placement of Swan-Ganz catheter and radial arterial line. The patient is placed in the supine position on the operating table.  Intravenous antibiotics are administered. General endotracheal anesthesia is induced uneventfully. A Foley catheter is  placed.  Baseline transesophageal echocardiogram was performed.  Findings were notable for congenitally bicuspid valve with single raphae between the fused left and right leaflets.  The noncoronary leaflet was relatively large.  The fused right and left leaflets were severely prolapsing.  There was severe aortic insufficiency.  The mitral valve was billowing traumatically due to the jet of aortic insufficiency blowing directly on the ventricular surface of the anterior leaflet of the mitral valve.  However, there was no significant mitral regurgitation.  Left ventricular function appeared normal.  The patient's chest, abdomen, both groins, and both lower extremities are prepared and draped in a sterile manner. A time out procedure is performed.   Surgical Approach:  A median sternotomy incision was performed and the pericardium is opened. The ascending aorta is normal in appearance.    Extracorporeal Cardiopulmonary Bypass and Myocardial Protection:  The ascending aorta and the right atrium are cannulated for cardiopulmonary bypass.  Adequate heparinization is verified.   A retrograde cardioplegia cannula is placed through the right atrium into the coronary sinus.  The operative field was continuously flooded with carbon dioxide gas.  The entire pre-bypass portion of the operation was notable for stable hemodynamics.  Cardiopulmonary bypass was begun and the surface of the heart is inspected.  A cardioplegia cannula is placed in the ascending aorta.  A temperature probe was placed in the interventricular septum.  The patient is cooled to 32C systemic temperature.  The aortic cross clamp is applied and cold blood cardioplegia is delivered initially in an antegrade fashion through the aortic root.   Iced saline slush is applied for topical hypothermia.  Once the patient develops ventricular fibrillation cardioplegia  is administered retrograde through the coronary sinus catheter.  Myocardial  cooling is notably relatively slow.  The aorta was dissected away from the pulmonary artery and the proximal aortic root exposed.  A transverse aortotomy incision was made approximately 1 cm above the origin of the right coronary artery.  Additional cardioplegia is administered using hand-held Spencer cannulas directly into the left main coronary artery.  Ultimately, myocardial protection was felt to be excellent with effective myocardial cooling to 10 C.    Aortic Valve Repair:  The aortic valve was inspected. The aortic valve was congenitally bicuspid with fused left and right cusps and a single raphae.  There was asymmetric sizing with a relatively small left coronary leaflet and a very large noncoronary leaflet.  There was partial cleft between the fused left and right leaflets.  There was some prolapsing of both the conjoined left right leaflet and the noncoronary leaflet, although there was severe prolapse involving primarily the conjoined left right leaflet.  Traction sutures are placed just above each of the 3 commissures and spread laterally to suspend the valve symmetrically. The valve is again carefully inspected. The aortic annulus is sized with a Hegar dilator and found to be approximately 23 mm, consistent with the preoperative echo.  Each of the valve leaflets are sized using specially developed sizers for the Biostable annuloplasty ring to measure the length of the free margin of each leaflet.    Primarily based upon the precise length of the free margin of the noncoronary leaflet, a decision is made to proceed with a 23 mm annuloplasty ring.  Biostable HAART 200 aortic ring annuloplasty (size 23 mm, ref # [id number], lot #[id number]) was implanted per manufacturer recommendations. Initially pledgeted Cabrol type pledgeted 4-0 Prolene sutures are placed beneath each of the commissures and through the posts of the ring. The the ring is subsequently lowered through the valve into the  sub-annular space. Multiple additional sutures are then placed at the base of each of the 3 sinuses of Valsalva to secure the ring beneath the valve. All of the sutures are individually tied and lateral fixation is utilized to pull each of the sutures away from the valve leaflets.  The valve is again carefully annualized. The slight prolapse of the noncoronary leaflet corrected using a pair of 7-0 Prolene plication sutures on either side of the Nodulus of Arantis.    Attention is now directed to the fused left right leaflet.  There is a fenestration with a short connection at the apex of the fused with a close to the aortic wall.  This is released and the nodular portion of the raphae is shaved with care to avoid penetration through the leaflet.  Additional fibrous tissue along the free margin of the fused left right leaflet is also shaved.  The cleft between the left right leaflet is now closed using interrupted 5-0 Prolene sutures.  After completion of closure of the cleft the valve is both pressurized and carefully inspected to make sure equivalent length of the free margin of the 2 leaflets, plicating them to 35 mm in length to assure cylindrical geometry with a 23 mm annuloplasty ring.  After completion of the repair the valve was carefully inspected.  There appears to be excellent leaflet mobility with a relatively large orifice size, equivalent dimensions of the free edge of both leaflets and a large surface area of coaptation.  Rewarming was begun.  The aortotomy was closed  using a 2-layer closure of running 4-0 Prolene  suture.  One final dose of warm "reanimation" cardioplegia was administered through the aortic root.  The aortic cross clamp was removed after a cross clamp time of 124 minutes.  Ventricular pacing wires are placed to the right ventricular free wall in the right atrium.  Prior to removal of any of the cannulas, the patient was weaned from cardiopulmonary bypass and valve function  carefully examined using transesophageal echocardiogram.  Initial inspection reveals intact valve repair.   Procedure Completion:  Epicardial pacing wires are fixed to the right ventricular outflow tract and to the right atrial appendage. The patient is rewarmed to 37C temperature. The aortic and left ventricular vents are removed.  The patient is weaned and disconnected from cardiopulmonary bypass.  The patient's rhythm at separation from bypass was AV paced.  The patient was weaned from cardiopulmonary bypass without any inotropic support. Total cardiopulmonary bypass time for the operation was 149 minutes.  Followup transesophageal echocardiogram performed after separation from bypass revealed a well-seated aortic valve ring annuloplasty and an aortic valve that was functioning normally and without any sign of residual leak.  Left ventricular function was unchanged from preoperatively.  The aortic and venous cannula were removed uneventfully. Protamine was administered to reverse the anticoagulation. The mediastinum and pleural space were inspected for hemostasis and irrigated with saline solution. The mediastinum was drained using 2 chest tubes placed through separate stab incisions inferiorly.  The soft tissues anterior to the aorta were reapproximated loosely. The sternum is closed with double strength sternal wire. The soft tissues anterior to the sternum were closed in multiple layers and the skin is closed with a running subcuticular skin closure.  The post-bypass portion of the operation was notable for stable rhythm and hemodynamics.  No blood products were administered during the operation.   Disposition:  The patient tolerated the procedure well and is transported to the surgical intensive care in stable condition. There are no intraoperative complications. All sponge instrument and needle counts are verified correct at completion of the operation.    Salvatore Decent. Cornelius Moras  MD 07/27/2020 1:06 PM

## 2020-07-27 NOTE — Progress Notes (Signed)
  Echocardiogram Echocardiogram Transesophageal has been performed.  Janalyn Harder 07/27/2020, 9:20 AM

## 2020-07-28 ENCOUNTER — Inpatient Hospital Stay (HOSPITAL_COMMUNITY): Payer: 59

## 2020-07-28 ENCOUNTER — Encounter (HOSPITAL_COMMUNITY): Payer: Self-pay | Admitting: Thoracic Surgery (Cardiothoracic Vascular Surgery)

## 2020-07-28 ENCOUNTER — Other Ambulatory Visit: Payer: Self-pay | Admitting: Student

## 2020-07-28 DIAGNOSIS — I351 Nonrheumatic aortic (valve) insufficiency: Secondary | ICD-10-CM

## 2020-07-28 DIAGNOSIS — I350 Nonrheumatic aortic (valve) stenosis: Secondary | ICD-10-CM

## 2020-07-28 DIAGNOSIS — Z9889 Other specified postprocedural states: Secondary | ICD-10-CM

## 2020-07-28 LAB — BASIC METABOLIC PANEL
Anion gap: 9 (ref 5–15)
Anion gap: 9 (ref 5–15)
BUN: 12 mg/dL (ref 6–20)
BUN: 12 mg/dL (ref 6–20)
CO2: 24 mmol/L (ref 22–32)
CO2: 27 mmol/L (ref 22–32)
Calcium: 7.2 mg/dL — ABNORMAL LOW (ref 8.9–10.3)
Calcium: 7.6 mg/dL — ABNORMAL LOW (ref 8.9–10.3)
Chloride: 105 mmol/L (ref 98–111)
Chloride: 99 mmol/L (ref 98–111)
Creatinine, Ser: 0.81 mg/dL (ref 0.61–1.24)
Creatinine, Ser: 0.91 mg/dL (ref 0.61–1.24)
GFR calc Af Amer: >60 mL/min (ref >60)
GFR calc Af Amer: >60 mL/min (ref >60)
GFR calc non Af Amer: >60 mL/min (ref >60)
GFR calc non Af Amer: >60 mL/min (ref >60)
Glucose, Bld: 134 mg/dL — ABNORMAL HIGH (ref 70–99)
Glucose, Bld: 188 mg/dL — ABNORMAL HIGH (ref 70–99)
Potassium: 4.1 mmol/L (ref 3.5–5.1)
Potassium: 3.8 mmol/L (ref 3.5–5.1)
Sodium: 138 mmol/L (ref 135–145)
Sodium: 135 mmol/L (ref 135–145)

## 2020-07-28 LAB — GLUCOSE, CAPILLARY
Glucose-Capillary: 122 mg/dL — ABNORMAL HIGH (ref 70–99)
Glucose-Capillary: 135 mg/dL — ABNORMAL HIGH (ref 70–99)
Glucose-Capillary: 135 mg/dL — ABNORMAL HIGH (ref 70–99)
Glucose-Capillary: 137 mg/dL — ABNORMAL HIGH (ref 70–99)
Glucose-Capillary: 142 mg/dL — ABNORMAL HIGH (ref 70–99)
Glucose-Capillary: 147 mg/dL — ABNORMAL HIGH (ref 70–99)
Glucose-Capillary: 147 mg/dL — ABNORMAL HIGH (ref 70–99)
Glucose-Capillary: 149 mg/dL — ABNORMAL HIGH (ref 70–99)
Glucose-Capillary: 153 mg/dL — ABNORMAL HIGH (ref 70–99)
Glucose-Capillary: 162 mg/dL — ABNORMAL HIGH (ref 70–99)
Glucose-Capillary: 168 mg/dL — ABNORMAL HIGH (ref 70–99)
Glucose-Capillary: 206 mg/dL — ABNORMAL HIGH (ref 70–99)

## 2020-07-28 LAB — POCT I-STAT 7, (LYTES, BLD GAS, ICA,H+H)
Acid-base deficit: 1 mmol/L (ref 0.0–2.0)
Acid-base deficit: 2 mmol/L (ref 0.0–2.0)
Bicarbonate: 26.3 mmol/L (ref 20.0–28.0)
Bicarbonate: 23.6 mmol/L (ref 20.0–28.0)
Calcium, Ion: 1.06 mmol/L — ABNORMAL LOW (ref 1.15–1.40)
Calcium, Ion: 1.01 mmol/L — ABNORMAL LOW (ref 1.15–1.40)
HCT: 35 % — ABNORMAL LOW (ref 39.0–52.0)
HCT: 33 % — ABNORMAL LOW (ref 39.0–52.0)
Hemoglobin: 11.9 g/dL — ABNORMAL LOW (ref 13.0–17.0)
Hemoglobin: 11.2 g/dL — ABNORMAL LOW (ref 13.0–17.0)
O2 Saturation: 94 %
O2 Saturation: 98 %
Patient temperature: 36.9
Patient temperature: 36.6
Potassium: 4.7 mmol/L (ref 3.5–5.1)
Potassium: 4.7 mmol/L (ref 3.5–5.1)
Sodium: 143 mmol/L (ref 135–145)
Sodium: 143 mmol/L (ref 135–145)
TCO2: 28 mmol/L (ref 22–32)
TCO2: 25 mmol/L (ref 22–32)
pCO2 arterial: 56.7 mmHg — ABNORMAL HIGH (ref 32.0–48.0)
pCO2 arterial: 40.1 mmHg (ref 32.0–48.0)
pH, Arterial: 7.274 — ABNORMAL LOW (ref 7.350–7.450)
pH, Arterial: 7.376 (ref 7.350–7.450)
pO2, Arterial: 83 mmHg (ref 83.0–108.0)
pO2, Arterial: 99 mmHg (ref 83.0–108.0)

## 2020-07-28 LAB — CBC
HCT: 33.6 % — ABNORMAL LOW (ref 39.0–52.0)
HCT: 34.6 % — ABNORMAL LOW (ref 39.0–52.0)
Hemoglobin: 11 g/dL — ABNORMAL LOW (ref 13.0–17.0)
Hemoglobin: 11.3 g/dL — ABNORMAL LOW (ref 13.0–17.0)
MCH: 29.7 pg (ref 26.0–34.0)
MCH: 30.1 pg (ref 26.0–34.0)
MCHC: 32.7 g/dL (ref 30.0–36.0)
MCHC: 32.7 g/dL (ref 30.0–36.0)
MCV: 90.8 fL (ref 80.0–100.0)
MCV: 92 fL (ref 80.0–100.0)
Platelets: 168 10*3/uL (ref 150–400)
Platelets: 147 10*3/uL — ABNORMAL LOW (ref 150–400)
RBC: 3.7 MIL/uL — ABNORMAL LOW (ref 4.22–5.81)
RBC: 3.76 MIL/uL — ABNORMAL LOW (ref 4.22–5.81)
RDW: 12.7 % (ref 11.5–15.5)
RDW: 13 % (ref 11.5–15.5)
WBC: 13.6 10*3/uL — ABNORMAL HIGH (ref 4.0–10.5)
WBC: 13.7 10*3/uL — ABNORMAL HIGH (ref 4.0–10.5)
nRBC: 0 % (ref 0.0–0.2)
nRBC: 0 % (ref 0.0–0.2)

## 2020-07-28 LAB — MAGNESIUM
Magnesium: 2.2 mg/dL (ref 1.7–2.4)
Magnesium: 2.1 mg/dL (ref 1.7–2.4)

## 2020-07-28 MED ORDER — ENOXAPARIN SODIUM 40 MG/0.4ML ~~LOC~~ SOLN
40.0000 mg | Freq: Every day | SUBCUTANEOUS | Status: DC
  Administered 2020-07-29 – 2020-08-01 (×4): 40 mg via SUBCUTANEOUS
  Filled 2020-07-28 (×4): qty 0.4

## 2020-07-28 MED ORDER — METOPROLOL TARTRATE 25 MG PO TABS
25.0000 mg | ORAL_TABLET | Freq: Two times a day (BID) | ORAL | Status: DC
  Administered 2020-07-28 – 2020-07-29 (×4): 25 mg via ORAL
  Filled 2020-07-28 (×4): qty 1

## 2020-07-28 MED ORDER — KETOROLAC TROMETHAMINE 15 MG/ML IJ SOLN
15.0000 mg | Freq: Four times a day (QID) | INTRAMUSCULAR | Status: AC
  Administered 2020-07-28 – 2020-07-29 (×5): 15 mg via INTRAVENOUS
  Filled 2020-07-28 (×5): qty 1

## 2020-07-28 MED ORDER — SODIUM CHLORIDE 0.9 % IV SOLN
INTRAVENOUS | Status: DC | PRN
  Administered 2020-07-28 (×2): 500 mL via INTRAVENOUS

## 2020-07-28 MED ORDER — FUROSEMIDE 10 MG/ML IJ SOLN
20.0000 mg | Freq: Four times a day (QID) | INTRAMUSCULAR | Status: AC
  Administered 2020-07-28 (×3): 20 mg via INTRAVENOUS
  Filled 2020-07-28 (×3): qty 2

## 2020-07-28 MED ORDER — INSULIN ASPART 100 UNIT/ML ~~LOC~~ SOLN
0.0000 [IU] | SUBCUTANEOUS | Status: DC
  Administered 2020-07-28: 8 [IU] via SUBCUTANEOUS
  Administered 2020-07-28 (×2): 2 [IU] via SUBCUTANEOUS

## 2020-07-28 MED ORDER — LABETALOL HCL 5 MG/ML IV SOLN
10.0000 mg | INTRAVENOUS | Status: DC | PRN
  Administered 2020-07-28 – 2020-07-29 (×6): 10 mg via INTRAVENOUS
  Filled 2020-07-28 (×5): qty 4

## 2020-07-28 NOTE — Progress Notes (Addendum)
[facility] DAILY ICU PROGRESS NOTE                   [address]            [name] [address]          [phone]   1 Day Post-Op Procedure(s) (LRB): AORTIC VALVE REPAIR USING HAART 200 AORTIC ANNULOPLASTY DEVICE SIZE (N/A) TRANSESOPHAGEAL ECHOCARDIOGRAM (TEE) (N/A)  Total Length of Stay:  LOS: 1 day   Subjective: Feels some pain but overall doing very well  Objective: Vital signs in last 24 hours: Temp:  [80.2 F (26.8 C)-99.1 F (37.3 C)] 98.4 F (36.9 C) (09/23 0700) Pulse Rate:  [78-119] 110 (09/23 0700) Cardiac Rhythm: Sinus tachycardia (09/23 0400) Resp:  [12-24] 21 (09/23 0700) BP: (91)/(70) 91/70 (09/22 1335) SpO2:  [89 %-100 %] 98 % (09/23 0700) Arterial Line BP: (92-166)/(31-90) 127/79 (09/23 0700) FiO2 (%):  [40 %-50 %] 40 % (09/22 1652) Weight:  [111.5 kg] 111.5 kg (09/23 0500)  Filed Weights   07/27/20 0652 07/28/20 0500  Weight: 106.6 kg 111.5 kg    Weight change: 4.905 kg   Hemodynamic parameters for last 24 hours: PAP: (17-41)/(7-25) 18/11 CO:  [3.6 L/min-6.9 L/min] 6.4 L/min CI:  [1.6 L/min/m2-3.2 L/min/m2] 2.9 L/min/m2  Intake/Output from previous day: 09/22 0701 - 09/23 0700 In: 5510.5 [I.V.:3839.2; Blood:486; IV Piggyback:1185.3] Out: 5280 [Urine:3915; Blood:800; Chest Tube:565]  Intake/Output this shift: No intake/output data recorded.  Current Meds: Scheduled Meds: . acetaminophen (TYLENOL) oral liquid 160 mg/5 mL  650 mg Per Tube Once  . acetaminophen  1,000 mg Oral Q6H  . aspirin EC  325 mg Oral Daily  . bisacodyl  10 mg Oral Daily   Or  . bisacodyl  10 mg Rectal Daily  . Chlorhexidine Gluconate Cloth  6 each Topical Daily  . Chlorhexidine Gluconate Cloth  6 each Topical Daily  . docusate sodium  200 mg Oral Daily  . [START ON 07/29/2020] enoxaparin (LOVENOX) injection  40 mg Subcutaneous QHS  . furosemide  20 mg Intravenous Q6H  . insulin aspart  0-24 Units Subcutaneous Q4H  . ketorolac  15 mg Intravenous Q6H   . metoprolol tartrate  25 mg Oral BID  . [START ON 07/29/2020] pantoprazole  40 mg Oral Daily  . sodium chloride flush  10-40 mL Intracatheter Q12H  . sodium chloride flush  3 mL Intravenous Q12H   Continuous Infusions: . sodium chloride    . cefUROXime (ZINACEF)  IV Stopped (07/27/20 1936)  . famotidine (PEPCID) IV Stopped (07/27/20 1409)  . lactated ringers    . lactated ringers 20 mL/hr at 07/28/20 0700   PRN Meds:.morphine injection, ondansetron (ZOFRAN) IV, oxyCODONE, sodium chloride flush, sodium chloride flush, traMADol  General appearance: alert, cooperative and no distress Heart: regular rate and rhythm and tachy Lungs: mildly dim in bases Abdomen: soft, non tender Extremities: + edema Wound: dressings CDI  Lab Results: CBC: Recent Labs    07/27/20 1930 07/27/20 1930 07/27/20 2106 07/28/20 0423  WBC 10.3  --   --  13.6*  HGB 11.3*   < > 10.2* 11.0*  HCT 34.2*   < > 30.0* 33.6*  PLT 157  --   --  168   < > = values in this interval not displayed.   BMET:  Recent Labs    07/27/20 1930 07/27/20 1930 07/27/20 2106 07/28/20 0423  NA 139   < > 143 138  K 4.5   < >  4.1 4.1  CL 109  --   --  105  CO2 25  --   --  24  GLUCOSE 154*  --   --  134*  BUN 14  --   --  12  CREATININE 0.86  --   --  0.81  CALCIUM 7.2*  --   --  7.2*   < > = values in this interval not displayed.    CMET: Lab Results  Component Value Date   WBC 13.6 (H) 07/28/2020   HGB 11.0 (L) 07/28/2020   HCT 33.6 (L) 07/28/2020   PLT 168 07/28/2020   GLUCOSE 134 (H) 07/28/2020   ALT 53 (H) 07/25/2020   AST 36 07/25/2020   NA 138 07/28/2020   K 4.1 07/28/2020   CL 105 07/28/2020   CREATININE 0.81 07/28/2020   BUN 12 07/28/2020   CO2 24 07/28/2020   INR 1.4 (H) 07/27/2020   HGBA1C 7.2 (H) 07/25/2020      PT/INR:  Recent Labs    07/27/20 1400  LABPROT 16.5*  INR 1.4*   Radiology: [facility] Chest Port 1 View  Result Date: 07/28/2020 CLINICAL DATA:  Chest tube.  Open-heart surgery.   Sore chest. EXAM: PORTABLE CHEST 1 VIEW COMPARISON:  07/27/2020. FINDINGS: Interim extubation and removal of NG tube. Mediastinal drainage catheter and stable position. Swan-Ganz catheter tip has been slightly withdrawn but remains in a right pulmonary artery branch. Cardiomegaly. Persistent bilateral subsegmental atelectatic changes. No prominent pleural effusion. No pneumothorax. IMPRESSION: 1. Interim extubation removal of NG tube. Mediastinal drainage catheter stable position. Swan-Ganz catheter tip has been slightly withdrawn but remains in a right pulmonary artery branch. 2.  Cardiomegaly. 3.  Persistent bilateral subsegmental atelectatic changes. Electronically Signed   By: Maisie Fus  Register   On: 07/28/2020 06:44   DG Chest Port 1 View  Result Date: 07/27/2020 CLINICAL DATA:  Hypoxia EXAM: PORTABLE CHEST 1 VIEW COMPARISON:  July 25, 2020 FINDINGS: Endotracheal tube tip is 2.6 cm above the carina. Nasogastric tube tip and side port are below the diaphragm. Swan-Ganz catheter tip is in the right lower lobe pulmonary artery, fairly peripheral in location. There is a left chest tube and a mediastinal drain. No evident pneumothorax. There is atelectatic change in each mid and lower lung region, somewhat more on the left than on the right. There is a small right pleural effusion. Heart is borderline enlarged with pulmonary vascularity normal. IMPRESSION: Tube and catheter positions as described without pneumothorax. Note the Swan-Ganz catheter is in the right lower lobe pulmonary artery fairly peripheral in location. Atelectatic change in each mid and lower lung region, more on the left than on the right. Small right pleural effusion. Stable cardiac prominence. Electronically Signed   By: Bretta Bang III M.D.   On: 07/27/2020 13:54     Assessment/Plan: S/P Procedure(s) (LRB): AORTIC VALVE REPAIR USING HAART 200 AORTIC ANNULOPLASTY DEVICE SIZE (N/A) TRANSESOPHAGEAL ECHOCARDIOGRAM (TEE)  (N/A)  POD#1  1 doing well- hemodyn stable on no gtts 2 some pain , adding toradol 3 sinus tachy- adding beta blocker 4 some volume overload, good UOP- adding lasix, renal fxn normal 5 mild  Expected ABL anemia- monitor clinically 6 mild leukocytosis, prob mild systemic inflamm reaction- monitor clinically, no fevers 7 CXR shows some atx, sean in a little far but to be removed this am 8 lovenox for DVT ppx 9 routine progression, pulm toilet, cardiac rehab Rowe Clack 07/28/2020 7:53 AM    I have seen  and examined the patient and agree with the assessment and plan as outlined.  Doing well POD1.  Mobilize.  Diuresis.  Start beta blocker.  Purcell Nails, MD 07/28/2020 8:19 AM

## 2020-07-28 NOTE — Discharge Instructions (Signed)
Surgical Aortic Valve Repair , Care After This sheet gives you information about how to care for yourself after your procedure. Your health care provider may also give you more specific instructions. If you have problems or questions, contact your health care provider. What can I expect after the procedure? After the procedure, it is common to have pain around your incision area. Follow these instructions at home: Medicines  Take over-the-counter and prescription medicines only as told by your health care provider.  If you were prescribed an antibiotic medicine, take it as told by your health care provider. Do not stop taking the antibiotic even if you start to feel better.  If you have a mechanical prosthesis, you may be given a blood thinner called warfarin. Follow instructions carefully on how to take this medicine.  Ask your health care provider if the medicine prescribed to you: ? Requires you to avoid driving or using heavy machinery. ? Can cause constipation. You may need to take actions to prevent or treat constipation, such as:  Take over-the-counter or prescription medicines.  Eat foods that are high in fiber, such as beans, whole grains, and fresh fruits and vegetables.  Limit foods that are high in fat and processed sugars, such as fried or sweet foods. Eating and drinking      Limit how much caffeine you drink. Caffeine can affect your heart's rate and rhythm.  Do not drink alcohol if: ? Your health care provider tells you not to drink. ? You are pregnant, may be pregnant, or are planning to become pregnant.  Drink enough fluid to keep your urine pale yellow.  Eat a heart-healthy diet that includes fruits, vegetables, whole grains, low-fat dairy products, and lean proteins like poultry and eggs. Incision care   Follow instructions from your health care provider about how to take care of your incision. Make sure you: ? Wash your hands with soap and water before and  after you change your bandage (dressing). If soap and water are not available, use hand sanitizer. ? Change your dressing as told by your health care provider. ? Leave stitches (sutures), skin glue, or adhesive strips in place. These skin closures may need to stay in place for 2 weeks or longer. If adhesive strip edges start to loosen and curl up, you may trim the loose edges. Do not remove adhesive strips completely unless your health care provider tells you to do that.  Check your incision area every day for signs of infection. Check for: ? Redness, swelling, or increasing pain. ? Fluid or blood. ? Warmth. ? Pus or a bad smell. Activity  Return to your normal activities as told by your health care provider. Most patients will need to limit any lifting or strenuous activity for 4-6 weeks.  Avoid sitting for a long time without moving. Get up to take short walks every 1-2 hours.  Do exercises as told by your health care provider.  Do not lift anything that is heavier than 10 lb (4.5 kg), or the limit that you are told, until your health care provider says that it is safe.  Avoid pushing or pulling things with your arms until your health care provider approves. This includes pulling on handrails to help you climb stairs. Lifestyle  Do not use any products that contain nicotine or tobacco, such as cigarettes, e-cigarettes, and chewing tobacco. These can delay incision healing after surgery. If you need help quitting, ask your health care provider.  Resume sexual activity as  told by your health care provider. If you have erectile dysfunction, do not use medicines to treat this condition unless your health care provider approves.  Work with your health care provider to: ? Keep your blood pressure and cholesterol under control. ? Manage any other heart conditions that you have. ? Maintain a healthy weight. Driving and travel  Do not drive until your health care provider approves. Ask your  health care provider when it is safe for you to drive.  Avoid airplane travel for as long as told by your health care provider.  When you travel, bring a list of your medicines and a record of your medical history with you. Carry your medicines with you. General instructions  Do not take baths, swim, or use a hot tub until your health care provider approves. You may shower.   Do not strain to have a bowel movement.  Avoid crossing your legs while sitting down.  Check your temperature every day for a fever. A fever may be a sign of infection.  If you are a woman and you plan to become pregnant, talk with your health care provider before you become pregnant.  Wear compression stockings as told by your health care provider. These stockings help to prevent blood clots and reduce swelling in your legs.  Tell all health care providers who care for you that you have an artificial (prosthetic) aortic valve. Also, tell them if you have or have had heart disease or endocarditis.  You will be given a card at discharge. The card indicates the type of prosthetic valve that you have. Keep this card in your wallet or purse for quick reference in case of an emergency.  Keep all follow-up visits as told by your health care provider. This is important. Contact a health care provider if:  You develop a skin rash.  You experience sudden, unexplained changes in your weight.  You have redness, swelling, or increasing pain around your incision.  You have fluid or blood coming from your incision.  Your incision feels warm to the touch.  You have pus or a bad smell coming from your incision.  You have a fever. Get help right away if you:  Develop chest pain that is different from the pain coming from your incision.  Develop shortness of breath or difficulty breathing.  Start to feel light-headed. These symptoms may represent a serious problem that is an emergency. Do not wait to see if the  symptoms will go away. Get medical help right away. Call your local emergency services (911 in the [place] Do not drive yourself to the hospital. Summary  After this procedure, it is common to have pain in the incision area.  Eat a heart-healthy diet. Follow instructions about alcohol use.  Get up to walk often. Avoid pushing and pulling with your arms. Ask what activities are safe for you.  Care for your incision as told by your health care provider. Check it daily for signs of infection.  Get help right away if you have chest pain that is different from your incisions, develop shortness of breath or difficulty breathing, or start to feel light-headed. This information is not intended to replace advice given to you by your health care provider. Make sure you discuss any questions you have with your health care provider. Document Revised: 07/17/2018 Document Reviewed: 07/17/2018 Elsevier Patient Education  2020 [employer].

## 2020-07-28 NOTE — Progress Notes (Signed)
[facility] BRIEF SICU PROGRESS NOTE  1 Day Post-Op  S/P Procedure(s) (LRB): AORTIC VALVE REPAIR USING HAART 200 AORTIC ANNULOPLASTY DEVICE SIZE (N/A) TRANSESOPHAGEAL ECHOCARDIOGRAM (TEE) (N/A)   Stable day NSR w/ stable BP - has required some labetalol for HTN Breathing comfortably w/ O2 sats 92-95% Excellent UOP  Plan: Continue current plan  Purcell Nails, MD 07/28/2020 6:50 PM

## 2020-07-28 NOTE — Plan of Care (Signed)
  Problem: Education: Goal: Will demonstrate proper wound care and an understanding of methods to prevent future damage Outcome: Progressing   Problem: Education: Goal: Knowledge of disease or condition will improve Outcome: Progressing   Problem: Education: Goal: Knowledge of the prescribed therapeutic regimen will improve Outcome: Progressing   Problem: Education: Goal: Individualized Educational Video(s) Outcome: Progressing   Problem: Cardiac: Goal: Will achieve and/or maintain hemodynamic stability Outcome: Progressing   Problem: Skin Integrity: Goal: Wound healing without signs and symptoms of infection Outcome: Progressing

## 2020-07-28 NOTE — Progress Notes (Signed)
Ordered outpatient Echo following aortic valve repair per protocol. Primary Cardiologist: Dr. Servando Salina.  CT Surgeon: Dr. Cornelius Moras.

## 2020-07-28 NOTE — Anesthesia Procedure Notes (Signed)
Central Venous Catheter Insertion Performed by: Arta Bruce, MD, anesthesiologist Patient location: Pre-op. Preanesthetic checklist: patient identified, IV checked, risks and benefits discussed, surgical consent, monitors and equipment checked, pre-op evaluation, timeout performed and anesthesia consent Position: Trendelenburg Hand hygiene performed  and maximum sterile barriers used  PA cath and Central line was placed.Sheath introducer Swan type:thermodilution Procedure performed using ultrasound guided technique. Ultrasound Notes:anatomy identified, needle tip was noted to be adjacent to the nerve/plexus identified, no ultrasound evidence of intravascular and/or intraneural injection and image(s) printed for medical record Attempts: 1 Following insertion, line sutured, dressing applied and Biopatch. Post procedure assessment: blood return through all ports, free fluid flow and no air  Patient tolerated the procedure well with no immediate complications.

## 2020-07-28 NOTE — Addendum Note (Signed)
Addendum  created 07/28/20 1637 by Arta Bruce, MD   Child order released for a procedure order, Clinical Note Signed, Intraprocedure Blocks edited

## 2020-07-28 NOTE — Discharge Summary (Signed)
Physician Discharge Summary  Patient ID: Dean Molina MRN: [id number] DOB/AGE: Oct 15, 1976 44 y.o.  Admit date: 07/27/2020 Discharge date: 08/02/2020  Admission Diagnoses: severe aortic insufficiency  Discharge Diagnoses:  Principal Problem:   S/P aortic valve repair Active Problems:   Nonrheumatic aortic valve insufficiency   Bicuspid aortic valve   Essential hypertension   Patient Active Problem List   Diagnosis Date Noted  . S/P aortic valve repair 07/27/2020  . Nonrheumatic aortic valve insufficiency 10/12/2019  . Bicuspid aortic valve 10/12/2019  . Coarctation of the aorta, complex 10/12/2019  . S/P repair of coarctation of aorta 10/12/2019  . Mixed hyperlipidemia 10/12/2019  . Essential hypertension 10/12/2019   HPI:  Patient is a 44 year old moderately obese male with history of congenital heart disease,hypertension, hyperlipidemia, and GE reflux disease whoreturns to the office today for further discuss treatment options for management of bicuspid aortic valve with severe primary aortic insufficiency.He was initially seen in consultation on November 30, 2019 and shortly after that on December 28, 2019. At that time the patient reported to be asymptomatic. We discussed the rationale for elective surgical intervention as well as the alternative of continued watchful waiting with close medical follow-up. The patient expressed considerable anxiety about the possibility of proceeding with surgery and requested follow-up appointment in 6 months.   Patient returns to the office on June 13, 2020 for follow-up and had recently undergone routine follow-up echocardiogram which revealed severe aortic insufficiency with preserved left ventricular systolic function. No significant changes in the left ventricular chamber size were reported. The patient states that he is now interested in proceeding with elective surgery. Although he still is doing well, he admits to decreased  energy and a tendency to get short of breath with more strenuous exertion such as going up more than 1 flight of stairs. He only gets short of breath with strenuous activity and overall he has not had to limit his activities. He is still trying to exercise on a regular basis, and he states that he was recently able to walk 2 miles at a normal pace without difficulty. He specifically denies any history of resting shortness of breath, PND, orthopnea, or lower extremity edema. He has not had any chest pain or chest tightness either with activity or at rest.  At that time we made tentative plans for elective aortic valve repair on July 27, 2020.  Patient returns to the office today with his mother present for follow-up consultation prior to surgery.  He reports no new problems or complaints.  He specifically denies any problems with shortness of breath either with activity or at rest.  He has not had any chest pain or chest tightness.  He has not had fevers, chills, nor productive cough.  He has not been exposed to any persons with known or suspected COVID-19 infection.  He has been fully vaccinated against the COVID-19 virus.  The patient and his studies were reviewed by Dr. Cornelius Moras and he was admitted this hospitalization electively for the procedure.   Discharged Condition: good  Hospital Course:  Patient was admitted electively and on 07/27/2020 he was taken to the operating room where he underwent the below described procedure.  He tolerated it well was taken to the surgical intensive care unit in stable condition.  Postoperative hospital course:  The patient was extubated using standard post cardiac surgical protocols without difficulty.  He has maintained stable hemodynamics and has not required inotropic or vasopressor support.  All routine lines, monitors and drainage  devices have been discontinued in the standard fashion.  He does have some postoperative volume overload but is responding  well to diuretics.  He is maintaining normal sinus rhythm and has had some sinus tachycardia.  Beta-blocker has been titrated with improvement.  He has also had some hypertension and has been restarted back on his ARB/HCTZ combination.Marland Kitchen  He does have an expected acute blood loss anemia which is mild and has stabilized.  Most recent hemoglobin hematocrit dated 9/26 are 11/34.  Renal function has remained within normal limits.  Most recent values dated 9/28 are 15/.81.  Incisions are noted to be healing well without evidence of infection.  He is tolerating diet.  He is aware that he is in the diabetic range in terms of his hemoglobin A1c and knows to follow-up with primary care as he may require medications in the near future as well as close outpatient monitoring.  Oxygen has been weaned and he maintains good saturations on room air .  He is tolerating diet without difficulty.  He is tolerating routine activities using cardiac rehab protocols.  At the time of discharge the patient is felt to be quite stable.   Consults: None  Significant Diagnostic Studies:  CLINICAL DATA:  Status post aortic valve repair.  EXAM: CHEST - 2 VIEW  COMPARISON:  07/29/2020  FINDINGS: The cardiomediastinal silhouette is unchanged.  Bibasilar atelectasis again noted with possible trace pleural effusions.  No pneumothorax identified.  A RIGHT IJ central venous catheter sheath is been removed.  No other interval change.  IMPRESSION: Unchanged bibasilar atelectasis with possible trace pleural effusions.  No pneumothorax.   Electronically Signed   By: Harmon Pier M.D.   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 Cornelius Moras, MD  Assistant:       Alleen Borne, MD and Rowe Clack, PA-C  Anesthesia:    Gaynelle Adu, MD  Operative Findings: ? Sievers type I bicuspid aortic valve with single raphe and left-right fusion ? Bileaflet prolapse with severe aortic insufficiency ? Normal left ventricular systolic function ? No residual aortic insufficiency after successful valve repair    Discharge Exam: Blood pressure (!) 135/98, pulse 94, temperature 98.5 F (36.9 C), temperature source Oral, resp. rate (!) 21, height 5\' 7"  (1.702 m), weight 110.7 kg, SpO2 100 %.   General appearance: alert, cooperative and no distress Heart: regular rate and rhythm and occas extrasystole Lungs: mildly dim in bases Abdomen: soft, non-tender Extremities: + edema, pitting Wound: incis healing well  Disposition: Discharge disposition: 01-Home or Self Care      Discharge Instructions    Amb Referral to  Cardiac Rehabilitation   Complete by: As directed    Diagnosis: Valve Repair   Valve: Aortic   After initial evaluation and assessments completed: Virtual Based Care may be provided alone or in conjunction with Phase 2 Cardiac Rehab based on patient barriers.: Yes   Amb Referral to Nutrition and Diabetic E   Complete by: As directed    Discharge patient   Complete by: As directed    Discharge disposition: 01-Home or Self Care   Discharge patient date: 08/02/2020     Allergies as of 08/02/2020      Reactions   Tamiflu [oseltamivir Phosphate] Nausea And Vomiting      Medication List    TAKE these medications   aspirin 325 MG EC tablet Take 1 tablet (325 mg total) by mouth daily. Start taking on: August 03, 2020   GARLIC  PO Take 1 tablet by mouth daily.   GINSENG COMPLEX PO Take 1 tablet by mouth daily.   lovastatin 40 MG tablet Commonly known as: MEVACOR Take 40 mg by mouth daily.   magnesium oxide 400 (241.3 Mg) MG tablet Commonly known as: MAG-OX Take 1 tablet (400 mg total) by mouth 2 (two) times daily.   metoprolol tartrate 50 MG tablet Commonly known as: LOPRESSOR Take 1 tablet (50 mg total) by mouth 2 (two) times daily.   multivitamin with minerals tablet Take 1 tablet by mouth daily.   omeprazole 20 MG capsule Commonly known as: PRILOSEC Take 20 mg by mouth daily.   OVER THE COUNTER MEDICATION Take 2 tablets by mouth in the morning and at bedtime. [place] apple cider vinegar   traMADol 50 MG tablet Commonly known as: ULTRAM Take 1 tablet (50 mg total) by mouth every 6 (six) hours as needed for moderate pain.   valsartan-hydrochlorothiazide 160-25 MG tablet Commonly known as: DIOVAN-HCT Take 1 tablet by mouth daily.   Zinc 50 MG Caps Take 50 mg by mouth daily.       Follow-up Information    Tobb, Kardie, DO Follow up.   Specialty: Cardiology Why: Please see discharge paperwork for follow-up appointment with cardiology. Contact information: [address] [place] [address] [phone]        Purcell Nails, MD Follow up.   Specialty: Cardiothoracic Surgery Why: Please see discharge paperworkfor follow-up appt with surgeon. Please obtain a chest Xray at [facility] 1/2 hour prior to to appointment. It is located in the same office complex on the first floor Contact information: [address] [place] [address] [phone]               Signed: Rowe Clack PA-C 08/02/2020, 11:43 AM

## 2020-07-29 ENCOUNTER — Inpatient Hospital Stay (HOSPITAL_COMMUNITY): Payer: 59

## 2020-07-29 LAB — GLUCOSE, CAPILLARY
Glucose-Capillary: 120 mg/dL — ABNORMAL HIGH (ref 70–99)
Glucose-Capillary: 157 mg/dL — ABNORMAL HIGH (ref 70–99)
Glucose-Capillary: 123 mg/dL — ABNORMAL HIGH (ref 70–99)
Glucose-Capillary: 114 mg/dL — ABNORMAL HIGH (ref 70–99)
Glucose-Capillary: 160 mg/dL — ABNORMAL HIGH (ref 70–99)

## 2020-07-29 LAB — CBC
HCT: 34.9 % — ABNORMAL LOW (ref 39.0–52.0)
Hemoglobin: 11.4 g/dL — ABNORMAL LOW (ref 13.0–17.0)
MCH: 30.2 pg (ref 26.0–34.0)
MCHC: 32.7 g/dL (ref 30.0–36.0)
MCV: 92.6 fL (ref 80.0–100.0)
Platelets: 154 10*3/uL (ref 150–400)
RBC: 3.77 MIL/uL — ABNORMAL LOW (ref 4.22–5.81)
RDW: 13.2 % (ref 11.5–15.5)
WBC: 12.6 10*3/uL — ABNORMAL HIGH (ref 4.0–10.5)
nRBC: 0 % (ref 0.0–0.2)

## 2020-07-29 LAB — BASIC METABOLIC PANEL
Anion gap: 9 (ref 5–15)
BUN: 14 mg/dL (ref 6–20)
CO2: 29 mmol/L (ref 22–32)
Calcium: 7.7 mg/dL — ABNORMAL LOW (ref 8.9–10.3)
Chloride: 98 mmol/L (ref 98–111)
Creatinine, Ser: 0.92 mg/dL (ref 0.61–1.24)
GFR calc Af Amer: >60 mL/min (ref >60)
GFR calc non Af Amer: >60 mL/min (ref >60)
Glucose, Bld: 118 mg/dL — ABNORMAL HIGH (ref 70–99)
Potassium: 4 mmol/L (ref 3.5–5.1)
Sodium: 136 mmol/L (ref 135–145)

## 2020-07-29 MED ORDER — INSULIN ASPART 100 UNIT/ML ~~LOC~~ SOLN
0.0000 [IU] | Freq: Three times a day (TID) | SUBCUTANEOUS | Status: DC
  Administered 2020-07-29 – 2020-07-31 (×7): 2 [IU] via SUBCUTANEOUS
  Administered 2020-07-31: 8 [IU] via SUBCUTANEOUS
  Administered 2020-07-31: 2 [IU] via SUBCUTANEOUS
  Administered 2020-07-31 – 2020-08-01 (×2): 4 [IU] via SUBCUTANEOUS
  Administered 2020-08-01: 2 [IU] via SUBCUTANEOUS
  Administered 2020-08-01 – 2020-08-02 (×2): 4 [IU] via SUBCUTANEOUS
  Administered 2020-08-02: 2 [IU] via SUBCUTANEOUS

## 2020-07-29 MED ORDER — ~~LOC~~ CARDIAC SURGERY, PATIENT & FAMILY EDUCATION: Freq: Once | Status: AC

## 2020-07-29 MED FILL — Lidocaine HCl Local Preservative Free (PF) Inj 2%: INTRAMUSCULAR | Qty: 15 | Status: AC

## 2020-07-29 MED FILL — Mannitol IV Soln 20%: INTRAVENOUS | Qty: 500 | Status: AC

## 2020-07-29 MED FILL — Heparin Sodium (Porcine) Inj 1000 Unit/ML: INTRAMUSCULAR | Qty: 20 | Status: AC

## 2020-07-29 MED FILL — Sodium Chloride IV Soln 0.9%: INTRAVENOUS | Qty: 2000 | Status: AC

## 2020-07-29 MED FILL — Electrolyte-R (PH 7.4) Solution: INTRAVENOUS | Qty: 5000 | Status: AC

## 2020-07-29 MED FILL — Sodium Bicarbonate IV Soln 8.4%: INTRAVENOUS | Qty: 50 | Status: AC

## 2020-07-29 NOTE — Addendum Note (Signed)
Addendum  created 07/29/20 0920 by Adair Laundry, CRNA   Order list changed

## 2020-07-29 NOTE — Progress Notes (Addendum)
[address]       [place] [address]             [phone]      2 Days Post-Op Procedure(s) (LRB): AORTIC VALVE REPAIR USING HAART 200 AORTIC ANNULOPLASTY DEVICE SIZE (N/A) TRANSESOPHAGEAL ECHOCARDIOGRAM (TEE) (N/A) Subjective: Feels ok, some nausea No BM post-op   Objective: Vital signs in last 24 hours: Temp:  [97.9 F (36.6 C)-98.8 F (37.1 C)] 97.9 F (36.6 C) (09/24 0330) Pulse Rate:  [86-117] 95 (09/24 0600) Cardiac Rhythm: Sinus tachycardia (09/23 2000) Resp:  [12-24] 14 (09/24 0600) BP: (104-141)/(57-101) 113/74 (09/24 0600) SpO2:  [77 %-99 %] 77 % (09/24 0600) Arterial Line BP: (137-143)/(80-84) 137/83 (09/23 1100) Weight:  [111.8 kg] 111.8 kg (09/24 0600)  Hemodynamic parameters for last 24 hours:    Intake/Output from previous day: 09/23 0701 - 09/24 0700 In: 732.1 [P.O.:240; I.V.:292.1; IV Piggyback:200] Out: 2770 [Urine:2560; Chest Tube:210] Intake/Output this shift: No intake/output data recorded.  General appearance: alert, cooperative and no distress Heart: regular rate and rhythm, no rub and no murmur Lungs: mildly dim in bases Abdomen: benign Extremities: minor edema Wound: dressing CDI  Lab Results: Recent Labs    07/28/20 1706 07/29/20 0328  WBC 13.7* 12.6*  HGB 11.3* 11.4*  HCT 34.6* 34.9*  PLT 147* 154   BMET:  Recent Labs    07/28/20 1706 07/29/20 0328  NA 135 136  K 3.8 4.0  CL 99 98  CO2 27 29  GLUCOSE 188* 118*  BUN 12 14  CREATININE 0.91 0.92  CALCIUM 7.6* 7.7*    PT/INR:  Recent Labs    07/27/20 1400  LABPROT 16.5*  INR 1.4*   ABG    Component Value Date/Time   PHART 7.313 (L) 07/27/2020 2106   HCO3 25.9 07/27/2020 2106   TCO2 27 07/27/2020 2106   ACIDBASEDEF 1.0 07/27/2020 2106   O2SAT 92.0 07/27/2020 2106   CBG (last 3)  Recent Labs    07/28/20 2320 07/29/20 0328 07/29/20 0703  GLUCAP 149* 120* 157*    Meds Scheduled Meds: . acetaminophen (TYLENOL) oral liquid  160 mg/5 mL  650 mg Per Tube Once  . acetaminophen  1,000 mg Oral Q6H  . aspirin EC  325 mg Oral Daily  . bisacodyl  10 mg Oral Daily   Or  . bisacodyl  10 mg Rectal Daily  . Chlorhexidine Gluconate Cloth  6 each Topical Daily  . Chlorhexidine Gluconate Cloth  6 each Topical Daily  . docusate sodium  200 mg Oral Daily  . enoxaparin (LOVENOX) injection  40 mg Subcutaneous QHS  . insulin aspart  0-24 Units Subcutaneous TID AC & HS  . ketorolac  15 mg Intravenous Q6H  . metoprolol tartrate  25 mg Oral BID  . pantoprazole  40 mg Oral Daily  . sodium chloride flush  10-40 mL Intracatheter Q12H  . sodium chloride flush  3 mL Intravenous Q12H   Continuous Infusions: . sodium chloride    . sodium chloride 10 mL/hr at 07/29/20 0600  . cefUROXime (ZINACEF)  IV Stopped (07/28/20 2041)  . lactated ringers    . lactated ringers Stopped (07/28/20 0940)   PRN Meds:.sodium chloride, labetalol, morphine injection, ondansetron (ZOFRAN) IV, oxyCODONE, sodium chloride flush, sodium chloride flush, traMADol  Xrays DG Chest Port 1 View  Result Date: 07/28/2020 CLINICAL DATA:  Chest tube.  Open-heart surgery.  Sore chest. EXAM: PORTABLE CHEST 1 VIEW COMPARISON:  07/27/2020. FINDINGS:  Interim extubation and removal of NG tube. Mediastinal drainage catheter and stable position. Swan-Ganz catheter tip has been slightly withdrawn but remains in a right pulmonary artery branch. Cardiomegaly. Persistent bilateral subsegmental atelectatic changes. No prominent pleural effusion. No pneumothorax. IMPRESSION: 1. Interim extubation removal of NG tube. Mediastinal drainage catheter stable position. Swan-Ganz catheter tip has been slightly withdrawn but remains in a right pulmonary artery branch. 2.  Cardiomegaly. 3.  Persistent bilateral subsegmental atelectatic changes. Electronically Signed   By: Maisie Fus  Register   On: 07/28/2020 06:44   DG Chest Port 1 View  Result Date: 07/27/2020 CLINICAL DATA:  Hypoxia EXAM:  PORTABLE CHEST 1 VIEW COMPARISON:  July 25, 2020 FINDINGS: Endotracheal tube tip is 2.6 cm above the carina. Nasogastric tube tip and side port are below the diaphragm. Swan-Ganz catheter tip is in the right lower lobe pulmonary artery, fairly peripheral in location. There is a left chest tube and a mediastinal drain. No evident pneumothorax. There is atelectatic change in each mid and lower lung region, somewhat more on the left than on the right. There is a small right pleural effusion. Heart is borderline enlarged with pulmonary vascularity normal. IMPRESSION: Tube and catheter positions as described without pneumothorax. Note the Swan-Ganz catheter is in the right lower lobe pulmonary artery fairly peripheral in location. Atelectatic change in each mid and lower lung region, more on the left than on the right. Small right pleural effusion. Stable cardiac prominence. Electronically Signed   By: Bretta Bang III M.D.   On: 07/27/2020 13:54    Assessment/Plan: S/P Procedure(s) (LRB): AORTIC VALVE REPAIR USING HAART 200 AORTIC ANNULOPLASTY DEVICE SIZE (N/A) TRANSESOPHAGEAL ECHOCARDIOGRAM (TEE) (N/A)  1 conts to do well POD#2 2 afeb, VSS, no gtts 3 sinus rhythm , rare sinus tachy, SBP 100-140 range 4 sats good on 2 liters, routine pulm toilet, CXR with some basilar ATX, interstitial fullness 5 good UOP, normal renal fxn, may need some lasix but spont diuresis is currently good 6 CT drainage decreasing, only 210 yesterday- tubes to be d/c'd 7 BS control pretty good for hospitalized patient, HgA1C 7.2 , diet control at home, will need close long term monitoring as may require meds 8 H/H is stable 9 no fevers, leukocytosis trending down 10 out of thrombocytopenia threshold 11 routine progression 12 poss home sun/mon, prob d/c epw's tomorrow if no events  LOS: 2 days    Rowe Clack PA-C Pager [phone] 07/29/2020    I have seen and examined the patient and agree with the  assessment and plan as outlined.  Doing well.  Restart ARB prior to d/c if BP will allow, perhaps at reduced dose.  Transfer 4E PCU  Purcell Nails, MD 07/29/2020

## 2020-07-29 NOTE — Progress Notes (Signed)
1000-pmw d/c'd without difficulty.  No drainage or ectopic beats noted.  Will continue to closely monitor.  Pt to be on bed rest x 1 hr.

## 2020-07-29 NOTE — Progress Notes (Signed)
Patient received from [facility]. CHG bath given. No obvious signs of distress. Neuro intact.   HR 110-120 Labetalol given at 1929 per [facility].

## 2020-07-29 NOTE — Progress Notes (Signed)
CT surgery p.m. Rounds  Stable day Out of bed moving with assistance  Blood pressure (!) 147/110, pulse (!) 122, temperature 99.4 F (37.4 C), temperature source Oral, resp. rate 20, height 5\' 7"  (1.702 m), weight 111.8 kg, SpO2 100 %. ls

## 2020-07-30 ENCOUNTER — Inpatient Hospital Stay (HOSPITAL_COMMUNITY): Payer: 59

## 2020-07-30 LAB — GLUCOSE, CAPILLARY
Glucose-Capillary: 146 mg/dL — ABNORMAL HIGH (ref 70–99)
Glucose-Capillary: 111 mg/dL — ABNORMAL HIGH (ref 70–99)
Glucose-Capillary: 130 mg/dL — ABNORMAL HIGH (ref 70–99)
Glucose-Capillary: 148 mg/dL — ABNORMAL HIGH (ref 70–99)

## 2020-07-30 MED ORDER — METOPROLOL TARTRATE 5 MG/5ML IV SOLN
2.5000 mg | INTRAVENOUS | Status: DC | PRN
  Administered 2020-07-30: 2.5 mg via INTRAVENOUS
  Filled 2020-07-30: qty 5

## 2020-07-30 MED ORDER — FUROSEMIDE 40 MG PO TABS
40.0000 mg | ORAL_TABLET | Freq: Every day | ORAL | Status: DC
  Administered 2020-07-30: 40 mg via ORAL

## 2020-07-30 MED ORDER — IRBESARTAN 75 MG PO TABS
37.5000 mg | ORAL_TABLET | Freq: Every day | ORAL | Status: DC
  Administered 2020-07-30 – 2020-08-01 (×3): 37.5 mg via ORAL
  Filled 2020-07-30 (×4): qty 0.5

## 2020-07-30 MED ORDER — POTASSIUM CHLORIDE CRYS ER 20 MEQ PO TBCR
20.0000 meq | EXTENDED_RELEASE_TABLET | Freq: Once | ORAL | Status: AC
  Administered 2020-07-30: 20 meq via ORAL
  Filled 2020-07-30: qty 1

## 2020-07-30 MED ORDER — AMIODARONE HCL 200 MG PO TABS
400.0000 mg | ORAL_TABLET | Freq: Two times a day (BID) | ORAL | Status: DC
  Administered 2020-07-30 – 2020-07-31 (×4): 400 mg via ORAL
  Filled 2020-07-30 (×4): qty 2

## 2020-07-30 MED ORDER — METOPROLOL TARTRATE 25 MG PO TABS
37.5000 mg | ORAL_TABLET | Freq: Two times a day (BID) | ORAL | Status: DC
  Administered 2020-07-30 – 2020-07-31 (×4): 37.5 mg via ORAL
  Filled 2020-07-30 (×4): qty 1

## 2020-07-30 MED ORDER — FUROSEMIDE 40 MG PO TABS
40.0000 mg | ORAL_TABLET | Freq: Every day | ORAL | Status: DC
  Administered 2020-07-31 – 2020-08-01 (×2): 40 mg via ORAL
  Filled 2020-07-30 (×3): qty 1

## 2020-07-30 NOTE — Progress Notes (Addendum)
Patient heart rate 130-131 at rest. Patient sitting in chair eating supper. bp 120/97. Will monitor patient. Jari Favre [facility] made aware and orders received.  Kyrin Garn, Randall An RN

## 2020-07-30 NOTE — Progress Notes (Signed)
CARDIAC REHAB PHASE I   PRE:  Rate/Rhythm: 110 ST with PVCs  BP:  Sitting: 123/99      SaO2: 95 RA  MODE:  Ambulation: 320 ft   POST:  Rate/Rhythm: 115 ST with PVCs  BP:  Sitting: 141/103    SaO2: 96 RA   Pt ambulated 395ft in hallway independently with steady gait. Pt denies pain or SOB, does c/o being able to feel his heart beating. Pt returned to bed. D/c education completed with pt and father. Pt educated on importance of site care and monitoring incision daily. Encouraged continued IS use, walks, and sternal precautions. Pt given in-the-tube sheet along with heart healthy diet. Reviewed restrictions and exercise guidelines. Will refer to CRP II [place]. Pt is interested in participating in Virtual Cardiac and Pulmonary Rehab. Pt advised that Virtual Cardiac and Pulmonary Rehab is provided at no cost to the patient.  Checklist:  1. Pt has smart device  ie smartphone and/or ipad for downloading an app  Yes 2. Reliable internet/wifi service    Yes 3. Understands how to use their smartphone and navigate within an app.  Yes  Pt verbalized understanding and is in agreement.   [id number] Reynold Bowen, RN BSN 07/30/2020 10:59 AM

## 2020-07-30 NOTE — Progress Notes (Signed)
Mobility Specialist - Progress Note   07/30/20 1404  Mobility  Activity Ambulated in hall  Level of Assistance Independent  Assistive Device None  Distance Ambulated (ft) 470 ft  Mobility Response Tolerated well  Mobility performed by Mobility specialist  $Mobility charge 1 Mobility    Pre-mobility: 91 HR During mobility: 121 HR Post-mobility: 116 HR  Pt endorsed feeling palpitations while ambulating, but said it improved with time. He denied any other sx's.   Mamie Levers Mobility Specialist Mobility Specialist Phone: [phone]

## 2020-07-30 NOTE — Progress Notes (Addendum)
      [address]       [place] [address]             [phone]      3 Days Post-Op Procedure(s) (LRB): AORTIC VALVE REPAIR USING HAART 200 AORTIC ANNULOPLASTY DEVICE SIZE (N/A) TRANSESOPHAGEAL ECHOCARDIOGRAM (TEE) (N/A) Subjective: Feels okay this morning. He is having some chest discomfort and getting some pain medicine this morning.   Objective: Vital signs in last 24 hours: Temp:  [97.7 F (36.5 C)-99.4 F (37.4 C)] 97.7 F (36.5 C) (09/25 0756) Pulse Rate:  [71-122] 71 (09/25 0756) Cardiac Rhythm: Heart block (09/25 0830) Resp:  [16-23] 20 (09/25 0756) BP: (115-147)/(83-110) 119/97 (09/25 0756) SpO2:  [92 %-100 %] 95 % (09/25 0756) Weight:  [240 kg] 112 kg (09/24 2020)     Intake/Output from previous day: 09/24 0701 - 09/25 0700 In: 380.1 [P.O.:360; I.V.:20.1] Out: 401 [Urine:400; Stool:1] Intake/Output this shift: Total I/O In: 240 [P.O.:240] Out: -   General appearance: alert, cooperative and no distress Heart: regular rate and rhythm, S1, S2 normal, no murmur, click, rub or gallop Lungs: clear to auscultation bilaterally Abdomen: soft, non-tender; bowel sounds normal; no masses,  no organomegaly Extremities: trace non pitting edema in bilateral extremities. Wound: clean and dry  Lab Results: Recent Labs    07/28/20 1706 07/29/20 0328  WBC 13.7* 12.6*  HGB 11.3* 11.4*  HCT 34.6* 34.9*  PLT 147* 154   BMET:  Recent Labs    07/28/20 1706 07/29/20 0328  NA 135 136  K 3.8 4.0  CL 99 98  CO2 27 29  GLUCOSE 188* 118*  BUN 12 14  CREATININE 0.91 0.92  CALCIUM 7.6* 7.7*    PT/INR:  Recent Labs    07/27/20 1400  LABPROT 16.5*  INR 1.4*   ABG    Component Value Date/Time   PHART 7.313 (L) 07/27/2020 2106   HCO3 25.9 07/27/2020 2106   TCO2 27 07/27/2020 2106   ACIDBASEDEF 1.0 07/27/2020 2106   O2SAT 92.0 07/27/2020 2106   CBG (last 3)  Recent Labs    07/29/20 1611 07/29/20 2112 07/30/20 0613  GLUCAP 114*  160* 146*    Assessment/Plan: S/P Procedure(s) (LRB): AORTIC VALVE REPAIR USING HAART 200 AORTIC ANNULOPLASTY DEVICE SIZE (N/A) TRANSESOPHAGEAL ECHOCARDIOGRAM (TEE) (N/A)  1. CV- sinus tachycardia 120bpm. BP well controlled. Increase metoprolol to 37.5mg  BID. Will restart ARB if appropriate before discharge.  2 Pulm-tolerating room air with good oxygen saturation. Chest tubes removed. Routine pulm toilet, CXR stable with trace pleural effusions. 3. Renal- normal renal fxn, weight is up 6kg-probably need to add some lasix 4. Endo- BS control pretty good for hospitalized patient, HgA1C 7.2 , diet control at home, will need close long term monitoring as may require meds 5. H/H is stable 6. no fevers, leukocytosis trending down  Plan: EPW removed yesterday. Sinus tach this morning, will monitor closely and see what he does after BB this morning.  Continue to ambulate in the halls. Continue to use Incentive spirometer.    LOS: 3 days    Sharlene Dory 07/30/2020  start ARB in hospital then resume his home dose Diovan at DC Cont po lasix, start po amiodarone for junctional tach/ atrial arrhytmia  patient examined and medical record reviewed,agree with above note. Kathlee Nations Trigt III 07/30/2020

## 2020-07-30 NOTE — Progress Notes (Addendum)
Patient ambulated in hallway with family heart rate 125-129 on monitor. Will monitor patient. Robbert Langlinais, Randall An rN

## 2020-07-31 LAB — BASIC METABOLIC PANEL
Anion gap: 7 (ref 5–15)
BUN: 17 mg/dL (ref 6–20)
CO2: 30 mmol/L (ref 22–32)
Calcium: 8.5 mg/dL — ABNORMAL LOW (ref 8.9–10.3)
Chloride: 100 mmol/L (ref 98–111)
Creatinine, Ser: 0.87 mg/dL (ref 0.61–1.24)
GFR calc Af Amer: >60 mL/min (ref >60)
GFR calc non Af Amer: >60 mL/min (ref >60)
Glucose, Bld: 117 mg/dL — ABNORMAL HIGH (ref 70–99)
Potassium: 3.9 mmol/L (ref 3.5–5.1)
Sodium: 137 mmol/L (ref 135–145)

## 2020-07-31 LAB — CBC
HCT: 34.3 % — ABNORMAL LOW (ref 39.0–52.0)
Hemoglobin: 11 g/dL — ABNORMAL LOW (ref 13.0–17.0)
MCH: 29.5 pg (ref 26.0–34.0)
MCHC: 32.1 g/dL (ref 30.0–36.0)
MCV: 92 fL (ref 80.0–100.0)
Platelets: 207 10*3/uL (ref 150–400)
RBC: 3.73 MIL/uL — ABNORMAL LOW (ref 4.22–5.81)
RDW: 13.2 % (ref 11.5–15.5)
WBC: 9.1 10*3/uL (ref 4.0–10.5)
nRBC: 0 % (ref 0.0–0.2)

## 2020-07-31 LAB — GLUCOSE, CAPILLARY
Glucose-Capillary: 143 mg/dL — ABNORMAL HIGH (ref 70–99)
Glucose-Capillary: 219 mg/dL — ABNORMAL HIGH (ref 70–99)
Glucose-Capillary: 147 mg/dL — ABNORMAL HIGH (ref 70–99)
Glucose-Capillary: 225 mg/dL — ABNORMAL HIGH (ref 70–99)

## 2020-07-31 MED ORDER — DIPHENHYDRAMINE HCL 25 MG PO CAPS
25.0000 mg | ORAL_CAPSULE | Freq: Every evening | ORAL | Status: DC | PRN
  Administered 2020-07-31: 25 mg via ORAL
  Filled 2020-07-31 (×2): qty 1

## 2020-07-31 MED ORDER — SORBITOL 70 % SOLN
30.0000 mL | Freq: Once | Status: AC
  Administered 2020-07-31: 30 mL via ORAL
  Filled 2020-07-31: qty 30

## 2020-07-31 NOTE — Progress Notes (Signed)
Dr. Donata Clay made aware that EKG had been done. Dean Molina, Randall An RN

## 2020-07-31 NOTE — Progress Notes (Addendum)
      [address]       [place] [address]             [phone]      4 Days Post-Op Procedure(s) (LRB): AORTIC VALVE REPAIR USING HAART 200 AORTIC ANNULOPLASTY DEVICE SIZE (N/A) TRANSESOPHAGEAL ECHOCARDIOGRAM (TEE) (N/A) Subjective: Had some trouble sleeping last night. Other than that he feels good.   Objective: Vital signs in last 24 hours: Temp:  [97.6 F (36.4 C)-98.3 F (36.8 C)] 97.8 F (36.6 C) (09/26 0816) Pulse Rate:  [94-121] 106 (09/26 0816) Cardiac Rhythm: Normal sinus rhythm (09/25 2035) Resp:  [15-20] 20 (09/26 0818) BP: (110-141)/(90-109) 137/101 (09/26 0818) SpO2:  [94 %-98 %] 98 % (09/26 0816) Weight:  [914 kg] 111 kg (09/26 0500)     Intake/Output from previous day: 09/25 0701 - 09/26 0700 In: 720 [P.O.:720] Out: -  Intake/Output this shift: No intake/output data recorded.  General appearance: alert, cooperative and no distress Heart: sinus tachycardia Lungs: clear to auscultation bilaterally Abdomen: soft, non-tender; bowel sounds normal; no masses,  no organomegaly Extremities: extremities normal, atraumatic, no cyanosis or edema Wound: clean and dry  Lab Results: Recent Labs    07/29/20 0328 07/31/20 0218  WBC 12.6* 9.1  HGB 11.4* 11.0*  HCT 34.9* 34.3*  PLT 154 207   BMET:  Recent Labs    07/29/20 0328 07/31/20 0218  NA 136 137  K 4.0 3.9  CL 98 100  CO2 29 30  GLUCOSE 118* 117*  BUN 14 17  CREATININE 0.92 0.87  CALCIUM 7.7* 8.5*    PT/INR: No results for input(s): LABPROT, INR in the last 72 hours. ABG    Component Value Date/Time   PHART 7.313 (L) 07/27/2020 2106   HCO3 25.9 07/27/2020 2106   TCO2 27 07/27/2020 2106   ACIDBASEDEF 1.0 07/27/2020 2106   O2SAT 92.0 07/27/2020 2106   CBG (last 3)  Recent Labs    07/30/20 1600 07/30/20 2038 07/31/20 0602  GLUCAP 130* 148* 143*    Assessment/Plan: S/P Procedure(s) (LRB): AORTIC VALVE REPAIR USING HAART 200 AORTIC ANNULOPLASTY DEVICE  SIZE (N/A) TRANSESOPHAGEAL ECHOCARDIOGRAM (TEE) (N/A)  1. CV-HR in the low 100s overnight but this morning 115bpm. He is on Amio 400mg  BID, Lopressor 37.5mg  BID, and Avapro 37.5mg  daily. He does have PRN lopressor.   2 Pulm-tolerating room air with good oxygen saturation. Routine pulm toilet, CXR stable with trace pleural effusions. 3. Renal- normal renal fxn, weight is up 6kg-continue daily lasix PO 4. Endo- BS control pretty good for hospitalized patient, HgA1C 7.2 , diet control at home, will need close long term monitoring as may require meds 5. H/H is stable 6. no fevers, leukocytosis trending down  Plan: POD 4 AV repair. Doing well overall. Watch HR one more day with medication changes. Continue ambulation and use of incentive spirometer. Added PRN benadryl for insomnia. Probably discharge tomorrow if he remains stable.    LOS: 4 days    07/31/2020   Check 12 lead for poss A-flutter Cont current med sorbitol ordered for constipation  patient examined and medical record reviewed,agree with above note. 08/02/2020 Trigt III 07/31/2020

## 2020-07-31 NOTE — Progress Notes (Signed)
Mobility Specialist - Progress Note   07/31/20 1115  Mobility  Activity  (Cancel)   Pt says he was told he can walk independently in the hallway and that he plans on doing that on his own. He says he has already walked once this am.   Mamie Levers Mobility Specialist Mobility Specialist Phone: [phone]

## 2020-07-31 NOTE — Progress Notes (Signed)
Patient ambulated in hallway with family. Will monitor patient. Amora Sheehy Jessup RN  

## 2020-08-01 ENCOUNTER — Encounter (HOSPITAL_COMMUNITY): Payer: Self-pay | Admitting: Thoracic Surgery (Cardiothoracic Vascular Surgery)

## 2020-08-01 LAB — GLUCOSE, CAPILLARY
Glucose-Capillary: 166 mg/dL — ABNORMAL HIGH (ref 70–99)
Glucose-Capillary: 100 mg/dL — ABNORMAL HIGH (ref 70–99)
Glucose-Capillary: 169 mg/dL — ABNORMAL HIGH (ref 70–99)
Glucose-Capillary: 136 mg/dL — ABNORMAL HIGH (ref 70–99)

## 2020-08-01 MED ORDER — LIVING WELL WITH DIABETES BOOK
Freq: Once | Status: AC
  Filled 2020-08-01: qty 1

## 2020-08-01 MED ORDER — METOPROLOL TARTRATE 50 MG PO TABS
50.0000 mg | ORAL_TABLET | Freq: Two times a day (BID) | ORAL | Status: DC
  Administered 2020-08-01 – 2020-08-02 (×3): 50 mg via ORAL
  Filled 2020-08-01 (×3): qty 1

## 2020-08-01 NOTE — Progress Notes (Addendum)
[address]       [place] [address]             [phone]      5 Days Post-Op Procedure(s) (LRB): AORTIC VALVE REPAIR USING HAART 200 AORTIC ANNULOPLASTY DEVICE SIZE (N/A) TRANSESOPHAGEAL ECHOCARDIOGRAM (TEE) (N/A) Subjective:  conts to be a low level tachy EKG shows idioventricular tachy in 100- 120's  Objective: Vital signs in last 24 hours: Temp:  [97.8 F (36.6 C)-98.4 F (36.9 C)] 97.9 F (36.6 C) (09/27 0532) Pulse Rate:  [100-113] 100 (09/27 0532) Cardiac Rhythm: Sinus tachycardia (09/26 2054) Resp:  [14-20] 18 (09/27 0533) BP: (119-142)/(94-101) 133/94 (09/27 0532) SpO2:  [95 %-99 %] 98 % (09/27 0532) Weight:  [111.3 kg] 111.3 kg (09/27 0533)  Hemodynamic parameters for last 24 hours:    Intake/Output from previous day: 09/26 0701 - 09/27 0700 In: 480 [P.O.:480] Out: -  Intake/Output this shift: No intake/output data recorded.  General appearance: alert, cooperative and no distress Heart: regular rate and rhythm and tachy Lungs: dim in bases, mild coarseness Abdomen: mild distension, soft, nontender Extremities: + edema Wound: incis healing well  Lab Results: Recent Labs    07/31/20 0218  WBC 9.1  HGB 11.0*  HCT 34.3*  PLT 207   BMET:  Recent Labs    07/31/20 0218  NA 137  K 3.9  CL 100  CO2 30  GLUCOSE 117*  BUN 17  CREATININE 0.87  CALCIUM 8.5*    PT/INR: No results for input(s): LABPROT, INR in the last 72 hours. ABG    Component Value Date/Time   PHART 7.313 (L) 07/27/2020 2106   HCO3 25.9 07/27/2020 2106   TCO2 27 07/27/2020 2106   ACIDBASEDEF 1.0 07/27/2020 2106   O2SAT 92.0 07/27/2020 2106   CBG (last 3)  Recent Labs    07/31/20 1700 07/31/20 2008 08/01/20 0530  GLUCAP 147* 225* 166*    Meds Scheduled Meds: . acetaminophen (TYLENOL) oral liquid 160 mg/5 mL  650 mg Per Tube Once  . acetaminophen  1,000 mg Oral Q6H  . aspirin EC  325 mg Oral Daily  . bisacodyl  10 mg Oral Daily    Or  . bisacodyl  10 mg Rectal Daily  . Chlorhexidine Gluconate Cloth  6 each Topical Daily  . Chlorhexidine Gluconate Cloth  6 each Topical Daily  . docusate sodium  200 mg Oral Daily  . enoxaparin (LOVENOX) injection  40 mg Subcutaneous QHS  . furosemide  40 mg Oral Daily  . insulin aspart  0-24 Units Subcutaneous TID AC & HS  . irbesartan  37.5 mg Oral Daily  . metoprolol tartrate  50 mg Oral BID  . pantoprazole  40 mg Oral Daily  . sodium chloride flush  10-40 mL Intracatheter Q12H  . sodium chloride flush  3 mL Intravenous Q12H   Continuous Infusions: . sodium chloride    . sodium chloride Stopped (07/29/20 0930)   PRN Meds:.sodium chloride, diphenhydrAMINE, metoprolol tartrate, ondansetron (ZOFRAN) IV, oxyCODONE, sodium chloride flush, sodium chloride flush, traMADol  Xrays No results found.  Assessment/Plan: S/P Procedure(s) (LRB): AORTIC VALVE REPAIR USING HAART 200 AORTIC ANNULOPLASTY DEVICE SIZE (N/A) TRANSESOPHAGEAL ECHOCARDIOGRAM (TEE) (N/A)  POD#5  1 remains tachy, some mild hypertension- will need to d/w MD further Meds titration 2 sats good on RA 3 BS poor control, he has never been told he was a diabetic , Hg A1C is 7.2- he knows he will need  close f/u with primary care. May consider starting glucophage 4 May need to increase diuretic temporarily as fairly edematous  5 no new labs  LOS: 5 days    Rowe Clack PA-C Pager [phone] 08/01/2020   I have seen and examined the patient and agree with the assessment and plan as outlined.  I think his rhythm may be sinus tach w/ LBBB but cannot r/o Aflutter.   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  I favor increasing beta blocker rather than start amiodarone at this time.  Will ask Cardiology team to review.  Try diet control for now and defer adding medical Rx for DMII to patient's primary care physician.  He needs diabetic teaching and follow up.  Purcell Nails,  MD 08/01/2020 8:46 AM    Reviewed telemetry rhythm and discussed w/ Dr. Bjorn Pippin.  When HR slows down P waves are visible, all suggesting that rhythm is sinus tach and not atrial flutter.  Purcell Nails, MD 08/01/2020 5:50 PM

## 2020-08-01 NOTE — Progress Notes (Signed)
Mobility Specialist - Progress Note   08/01/20 1301  Mobility  Activity Refused mobility    Pt states he has already ambulated 2x today and would like to continue ambulating on his own.   Dean Molina Mobility Specialist Mobility Specialist Phone: [phone]

## 2020-08-01 NOTE — Progress Notes (Signed)
CARDIAC REHAB PHASE I   Pt seen walking independently in hallway. Joing pt. Pt states pain is improved. Working on coughing and deep breathing. Encouraged continued ambulation and IS use. HR during walk noted to be 120s. Will continue to follow.  [id number] Reynold Bowen, RN BSN 08/01/2020 9:29 AM

## 2020-08-01 NOTE — Progress Notes (Addendum)
Inpatient Diabetes Program Recommendations  AACE/ADA: New Consensus Statement on Inpatient Glycemic Control (2015)  Target Ranges:  Prepandial:   less than 140 mg/dL      Peak postprandial:   less than 180 mg/dL (1-2 hours)      Critically ill patients:  140 - 180 mg/dL   Results for Dean Molina, Dean "MATT" (MRN [id number]) as of 08/01/2020 15:29  Ref. Range 07/31/2020 06:02 07/31/2020 11:18 07/31/2020 17:00 07/31/2020 20:08  Glucose-Capillary Latest Ref Range: 70 - 99 mg/dL 143 (H)  2 units NOVOLOG  219 (H)  8 units NOVOLOG  147 (H)  2 units NOVOLOG  225 (H)  4 units NOVOLOG    Results for Dean Molina, Dean "MATT" (MRN [id number]) as of 08/01/2020 15:29  Ref. Range 08/01/2020 05:30 08/01/2020 11:38  Glucose-Capillary Latest Ref Range: 70 - 99 mg/dL 166 (H)  4  units NOVOLOG  100 (H)   Results for Dean Molina, Dean "MATT" (MRN [id number]) as of 08/01/2020 15:29  Ref. Range 07/25/2020 12:49  Hemoglobin A1C Latest Ref Range: 4.8 - 5.6 % 7.2 (H)  (159 mg/dl)    Admit for AVR   New Diagnosis of Diabetes made this admission  Current Orders: Novolog 0-24 units tid ac/hs    MD- Note this is a new diagnosis of diabetes for this patient  Will order educational materials and RD consult  Plan to speak with pt as well and discuss new diagnosis  At time of d/c home, may consider starting Metformin 500 mg BID    Addendum 3:40pm--Met with pt at bedside this afternoon after receiving referral for TCTS PA that pt has new diagnosis of diabetes.  Pt told me he was not told that he has diabetes, however, he stated the MD mentioned something about diabetes but that he (the doctor) is not a diabetes MD and would like for the patient to follow up with his PCP.  Pt told me his father has pre-diabetes.  Discussed with pt that I am not allowed to diagnose any condition, however, given his CBGs and Current A1c that these levels indicate diabetes.  Spoke with pt about new diagnosis.  Discussed A1C  results with him and explained what an A1C is, basic pathophysiology of DM Type 2, basic home care, basic diabetes diet nutrition principles, importance of checking CBGs and maintaining good CBG control to prevent long-term and short-term complications. Also reviewed blood sugar goals and A1c goals for home.    RNs to provide ongoing basic DM education at bedside with this patient.  Have ordered educational booklet and RD consult for DM diet education for this patient.  Discussed with pt checking CBGs at home.  Pt stated he thinks he will be able to handle this task.  I have asked pt's primary RN to please show and allow pt to check his own CBGs while in the hospital for practice.       --Will follow patient during hospitalization--  Wyn Quaker RN, MSN, CDE Diabetes Coordinator Inpatient Glycemic Control Team Team Pager: [phone] (8a-5p)

## 2020-08-01 NOTE — Progress Notes (Signed)
Patient ambulated in hallway several times today with significant other. Back in chair will monitor patient. Dean Molina, Randall An RN

## 2020-08-02 LAB — GLUCOSE, CAPILLARY
Glucose-Capillary: 125 mg/dL — ABNORMAL HIGH (ref 70–99)
Glucose-Capillary: 200 mg/dL — ABNORMAL HIGH (ref 70–99)

## 2020-08-02 LAB — BASIC METABOLIC PANEL
Anion gap: 11 (ref 5–15)
BUN: 15 mg/dL (ref 6–20)
CO2: 27 mmol/L (ref 22–32)
Calcium: 9 mg/dL (ref 8.9–10.3)
Chloride: 96 mmol/L — ABNORMAL LOW (ref 98–111)
Creatinine, Ser: 0.81 mg/dL (ref 0.61–1.24)
GFR calc Af Amer: >60 mL/min (ref >60)
GFR calc non Af Amer: >60 mL/min (ref >60)
Glucose, Bld: 213 mg/dL — ABNORMAL HIGH (ref 70–99)
Potassium: 4 mmol/L (ref 3.5–5.1)
Sodium: 134 mmol/L — ABNORMAL LOW (ref 135–145)

## 2020-08-02 LAB — MAGNESIUM: Magnesium: 1.8 mg/dL (ref 1.7–2.4)

## 2020-08-02 MED ORDER — IRBESARTAN 150 MG PO TABS: 150.0000 mg | ORAL_TABLET | Freq: Every day | ORAL | Status: DC

## 2020-08-02 MED ORDER — HYDROCHLOROTHIAZIDE 25 MG PO TABS: 25.0000 mg | ORAL_TABLET | Freq: Every day | ORAL | Status: DC

## 2020-08-02 MED ORDER — HYDROCHLOROTHIAZIDE 25 MG PO TABS
25.0000 mg | ORAL_TABLET | Freq: Every day | ORAL | Status: DC
  Administered 2020-08-02: 25 mg via ORAL

## 2020-08-02 MED ORDER — VALSARTAN-HYDROCHLOROTHIAZIDE 160-25 MG PO TABS: 1.0000 | ORAL_TABLET | Freq: Every day | ORAL | Status: DC

## 2020-08-02 MED ORDER — MAGNESIUM OXIDE 400 (241.3 MG) MG PO TABS
400.0000 mg | ORAL_TABLET | Freq: Two times a day (BID) | ORAL | Status: DC
  Administered 2020-08-02: 400 mg via ORAL
  Filled 2020-08-02: qty 1

## 2020-08-02 MED ORDER — HYDROCHLOROTHIAZIDE 25 MG PO TABS
25.0000 mg | ORAL_TABLET | Freq: Every day | ORAL | Status: DC
  Filled 2020-08-02: qty 1

## 2020-08-02 MED ORDER — ASPIRIN 325 MG PO TBEC: 325.0000 mg | DELAYED_RELEASE_TABLET | Freq: Every day | ORAL | Status: AC

## 2020-08-02 MED ORDER — IRBESARTAN 150 MG PO TABS
150.0000 mg | ORAL_TABLET | Freq: Every day | ORAL | Status: DC
  Administered 2020-08-02: 150 mg via ORAL
  Filled 2020-08-02: qty 1

## 2020-08-02 MED ORDER — METOPROLOL TARTRATE 50 MG PO TABS: 50.0000 mg | ORAL_TABLET | Freq: Two times a day (BID) | ORAL | 1 refills | Status: AC

## 2020-08-02 MED ORDER — TRAMADOL HCL 50 MG PO TABS
50.0000 mg | ORAL_TABLET | Freq: Four times a day (QID) | ORAL | 0 refills | Status: AC | PRN
Start: 2020-08-02 — End: ?

## 2020-08-02 MED ORDER — MAGNESIUM OXIDE 400 (241.3 MG) MG PO TABS
400.0000 mg | ORAL_TABLET | Freq: Two times a day (BID) | ORAL | 0 refills | Status: DC
Start: 2020-08-02 — End: 2020-08-17

## 2020-08-02 MED FILL — Heparin Sodium (Porcine) Inj 1000 Unit/ML: INTRAMUSCULAR | Qty: 30 | Status: AC

## 2020-08-02 MED FILL — Potassium Chloride Inj 2 mEq/ML: INTRAVENOUS | Qty: 40 | Status: AC

## 2020-08-02 MED FILL — Lidocaine HCl Local Preservative Free (PF) Inj 2%: INTRAMUSCULAR | Qty: 15 | Status: AC

## 2020-08-02 NOTE — Progress Notes (Signed)
[address]       [place] [address]             [phone]      6 Days Post-Op Procedure(s) (LRB): AORTIC VALVE REPAIR USING HAART 200 AORTIC ANNULOPLASTY DEVICE SIZE (N/A) TRANSESOPHAGEAL ECHOCARDIOGRAM (TEE) (N/A) Subjective: Feels "funny", some nausea fairly nonspecific  Objective: Vital signs in last 24 hours: Temp:  [97.5 F (36.4 C)-98.3 F (36.8 C)] 98.2 F (36.8 C) (09/28 0806) Pulse Rate:  [87-119] 102 (09/28 0806) Cardiac Rhythm: Sinus tachycardia (09/28 0704) Resp:  [12-19] 13 (09/28 0806) BP: (116-152)/(83-112) 152/108 (09/28 0806) SpO2:  [96 %-99 %] 98 % (09/28 0806) Weight:  [110.7 kg] 110.7 kg (09/28 0533)  Hemodynamic parameters for last 24 hours:    Intake/Output from previous day: No intake/output data recorded. Intake/Output this shift: No intake/output data recorded.  General appearance: alert, cooperative and no distress Heart: regular rate and rhythm and occas extrasystole Lungs: mildly dim in bases Abdomen: soft, non-tender Extremities: + edema, pitting Wound: incis healing well  Lab Results: Recent Labs    07/31/20 0218  WBC 9.1  HGB 11.0*  HCT 34.3*  PLT 207   BMET:  Recent Labs    07/31/20 0218  NA 137  K 3.9  CL 100  CO2 30  GLUCOSE 117*  BUN 17  CREATININE 0.87  CALCIUM 8.5*    PT/INR: No results for input(s): LABPROT, INR in the last 72 hours. ABG    Component Value Date/Time   PHART 7.313 (L) 07/27/2020 2106   HCO3 25.9 07/27/2020 2106   TCO2 27 07/27/2020 2106   ACIDBASEDEF 1.0 07/27/2020 2106   O2SAT 92.0 07/27/2020 2106   CBG (last 3)  Recent Labs    08/01/20 1633 08/01/20 2129 08/02/20 0614  GLUCAP 169* 136* 125*    Meds Scheduled Meds: . acetaminophen (TYLENOL) oral liquid 160 mg/5 mL  650 mg Per Tube Once  . aspirin EC  325 mg Oral Daily  . bisacodyl  10 mg Oral Daily   Or  . bisacodyl  10 mg Rectal Daily  . Chlorhexidine Gluconate Cloth  6 each Topical Daily   . Chlorhexidine Gluconate Cloth  6 each Topical Daily  . docusate sodium  200 mg Oral Daily  . enoxaparin (LOVENOX) injection  40 mg Subcutaneous QHS  . irbesartan  150 mg Oral Daily   Or  . hydrochlorothiazide  25 mg Oral Daily  . insulin aspart  0-24 Units Subcutaneous TID AC & HS  . metoprolol tartrate  50 mg Oral BID  . pantoprazole  40 mg Oral Daily  . sodium chloride flush  10-40 mL Intracatheter Q12H  . sodium chloride flush  3 mL Intravenous Q12H   Continuous Infusions: . sodium chloride    . sodium chloride Stopped (07/29/20 0930)   PRN Meds:.sodium chloride, diphenhydrAMINE, metoprolol tartrate, ondansetron (ZOFRAN) IV, oxyCODONE, sodium chloride flush, sodium chloride flush, traMADol  Xrays No results found.  Assessment/Plan: S/P Procedure(s) (LRB): AORTIC VALVE REPAIR USING HAART 200 AORTIC ANNULOPLASTY DEVICE SIZE (N/A) TRANSESOPHAGEAL ECHOCARDIOGRAM (TEE) (N/A)  1 overall doing well 2 afeb, tachy is improving, BP control is a little worse, in particular diast olic- starting ARB 3 some PVC's, will check K+/Mg++ 4 on HCTZ, , remains edematous- continue 5 sats good on RA 6 BS control is good- seen by DM coordinator- will be following up closely with primary care   LOS: 6 days    Berkey E  Dean Merida PA-C Pager [phone] 08/02/2020

## 2020-08-02 NOTE — Progress Notes (Signed)
Orders received to discharge patient.  Telemetry monitor removed and CCMD notified.  PIV access removed.  Discharge instructions, follow up, medications and instructions for their use discussed with patient. 

## 2020-08-02 NOTE — Progress Notes (Signed)
Revisited with pt again today prior to d/c home.  Pt did not have any follow up diabetes questions at this time.  I encouraged pt to call his PCP after d/c to let them know he was diagnosed with diabetes in the hospital and to see if they would like to see him prior to his annual wellness visit in November.  Encouraged pt to buy a CBG meter OTC and to begin checking CBGs at home at least once daily (either before a meal or 2 hours after) and to check at different times on different days.  Also re-reviewed CBG goals for home.    --Will follow patient during hospitalization--  Ambrose Finland RN, MSN, CDE Diabetes Coordinator Inpatient Glycemic Control Team Team Pager: [phone] (8a-5p)

## 2020-08-02 NOTE — Plan of Care (Signed)
  RD consulted for nutrition education regarding diabetes.   Lab Results  Component Value Date   HGBA1C 7.2 (H) 07/25/2020    RD provided "Carbohydrate Counting for People with Diabetes" handout from the Academy of Nutrition and Dietetics. Discussed different food groups and their effects on blood sugar, emphasizing carbohydrate-containing foods. Provided list of carbohydrates and recommended serving sizes of common foods.  Discussed importance of controlled and consistent carbohydrate intake throughout the day. Provided examples of ways to balance meals/snacks and encouraged intake of high-fiber, whole grain complex carbohydrates. Teach back method used.  Patient typically eats two meals daily that consist of B-eggs, vegetable, banana, coffee and L- salad. Drinks mostly water and coffee with french vanilla creamer. Reviewed current A1C result and what that means. Discussed how to make meal balanced, which foods contain carbohydrates, how to count carbs, and how to read a food label. Encouraged fiber intake. Patient shows understanding of education but would benefit from outpatient visit. RD to order.   Expect good compliance.  Current diet order is heart healthy carb modified, patient is consuming approximately 75-100% of meals at this time. Labs and medications reviewed. No further nutrition interventions warranted at this time. RD contact information provided. If additional nutrition issues arise, please re-consult RD.  Vanessa Kick RD, LDN Clinical Nutrition Pager listed in AMION

## 2020-08-02 NOTE — Progress Notes (Signed)
CARDIAC REHAB PHASE I   Offered to walk with pt. Pt states he is not feeling well this am. Pt given some salt-less saltines for nausea. Encouraged ambulation and IS use as able. Emphasized importance of balance.   [id number] Reynold Bowen, RN BSN 08/02/2020 8:36 AM

## 2020-08-04 ENCOUNTER — Telehealth (HOSPITAL_COMMUNITY): Payer: Self-pay

## 2020-08-04 NOTE — Telephone Encounter (Signed)
Pt insurance is active and benefits verified through [place]. Co-pay $0.00, DED $750.00/$750.00 met, out of pocket $4,250.00/$3,509.02 met, co-insurance 75%. NO pre-authorization required. Robin/[employer], 08/04/20 @ 3/38PM, REF#[id number]  Will contact patient to see if he is interested in the Cardiac Rehab Program. If interested, patient will need to complete follow up appt. Once completed, patient will be contacted for scheduling upon review by the RN Navigator.

## 2020-08-04 NOTE — Telephone Encounter (Signed)
Called patient to see if he is interested in the Cardiac Rehab Program. Patient declined due to co-insurance.  Closed referral

## 2020-08-05 ENCOUNTER — Ambulatory Visit (INDEPENDENT_AMBULATORY_CARE_PROVIDER_SITE_OTHER): Payer: Self-pay

## 2020-08-05 ENCOUNTER — Other Ambulatory Visit: Payer: Self-pay

## 2020-08-05 DIAGNOSIS — Z4802 Encounter for removal of sutures: Secondary | ICD-10-CM

## 2020-08-05 NOTE — Progress Notes (Signed)
Removed 2 chest tube sutures with no signs of infection and patient tolerated well. 

## 2020-08-11 ENCOUNTER — Ambulatory Visit: Payer: 59 | Admitting: Cardiology

## 2020-08-16 ENCOUNTER — Other Ambulatory Visit: Payer: Self-pay | Admitting: Thoracic Surgery (Cardiothoracic Vascular Surgery)

## 2020-08-16 DIAGNOSIS — Z9889 Other specified postprocedural states: Secondary | ICD-10-CM

## 2020-08-17 ENCOUNTER — Encounter: Payer: Self-pay | Admitting: Cardiology

## 2020-08-17 ENCOUNTER — Ambulatory Visit (INDEPENDENT_AMBULATORY_CARE_PROVIDER_SITE_OTHER): Payer: 59 | Admitting: Cardiology

## 2020-08-17 ENCOUNTER — Other Ambulatory Visit: Payer: Self-pay

## 2020-08-17 VITALS — BP 110/80 | HR 97 | Ht 67.0 in | Wt 222.0 lb

## 2020-08-17 DIAGNOSIS — Z8774 Personal history of (corrected) congenital malformations of heart and circulatory system: Secondary | ICD-10-CM

## 2020-08-17 DIAGNOSIS — Z9889 Other specified postprocedural states: Secondary | ICD-10-CM

## 2020-08-17 DIAGNOSIS — I351 Nonrheumatic aortic (valve) insufficiency: Secondary | ICD-10-CM

## 2020-08-17 DIAGNOSIS — E782 Mixed hyperlipidemia: Secondary | ICD-10-CM

## 2020-08-17 DIAGNOSIS — Q231 Congenital insufficiency of aortic valve: Secondary | ICD-10-CM | POA: Diagnosis not present

## 2020-08-17 NOTE — Patient Instructions (Signed)
Medication Instructions:  Your physician recommends that you continue on your current medications as directed. Please refer to the Current Medication list given to you today.  *If you need a refill on your cardiac medications before your next appointment, please call your pharmacy*   Lab Work: None.  If you have labs (blood work) drawn today and your tests are completely normal, you will receive your results only by: . MyChart Message (if you have MyChart) OR . A paper copy in the mail If you have any lab test that is abnormal or we need to change your treatment, we will call you to review the results.   Testing/Procedures: None.    Follow-Up: At [facility], you and your health needs are our priority.  As part of our continuing mission to provide you with exceptional heart care, we have created designated Provider Care Teams.  These Care Teams include your primary Cardiologist (physician) and Advanced Practice Providers (APPs -  Physician Assistants and Nurse Practitioners) who all work together to provide you with the care you need, when you need it.  We recommend signing up for the patient portal called "MyChart".  Sign up information is provided on this After Visit Summary.  MyChart is used to connect with patients for Virtual Visits (Telemedicine).  Patients are able to view lab/test results, encounter notes, upcoming appointments, etc.  Non-urgent messages can be sent to your provider as well.   To learn more about what you can do with MyChart, go to [email or web address].    Your next appointment:   2 month(s)  The format for your next appointment:   In Person  Provider:   Kardie Tobb, DO   Other Instructions   

## 2020-08-17 NOTE — Progress Notes (Signed)
Cardiology Office Note:    Date:  08/17/2020   ID:  Dean Molina, DOB 1976/05/06, MRN [id number]  PCP:  Tally Joe, MD  Cardiologist:  Thomasene Ripple, DO  Electrophysiologist:  None   Referring MD: Tally Joe, MD   Chief Complaint  Patient presents with  . Follow-up   History of Present Illness:    Dean Molina a 44 y.o.malewith a hx of hypertension, bicuspid aortic valve with severe aortic regurgitation status post aortic valve repair with biostable HAART aortic ring anoplasty, complex valvuloplasty including release of single raphe free edge plication repair of bileaflet prolapse on July 27, 2020, status post coarctation repair when the patient was 18 months, congenital pulmonic stenosis status post balloon valvuloplasty at age 37,obesityand hyperlipidemia.  He had an uncomplicated hospital stay.  He is here today for follow-up visit.  The patient is here with his mother.  He tells me that he is doing well most days but sometimes he does not notice his blood pressure is a little higher diastolic wise.  Does improve throughout the day but at times he feels that his heart rate is staying around the low 100s.  No other complaints at this time.  He started doing some exercise in terms of walking, unfortunately he has not been able to start cardiac rehab due to cost.  Past Medical History:  Diagnosis Date  . Acid reflux   . Aortic insufficiency due to bicuspid aortic valve   . Bicuspid aortic valve   . Coarctation of the aorta, s/p repair   . Essential hypertension 10/12/2019  . GERD (gastroesophageal reflux disease)   . Heart murmur   . Hyperlipidemia   . Hypertension   . Mixed hyperlipidemia 10/12/2019  . Nonrheumatic aortic valve insufficiency 10/12/2019  . Obesity (BMI 30-39.9)   . pulmonic valve stenosis s/p balloon valvuloplasty   . S/P aortic valve repair 07/27/2020   Complex valvuloplasty including release of raphe, free edge plication prolapse repair, and  23 mm HAART Biostable ring annuloplasty  . S/P repair of coarctation of aorta     Past Surgical History:  Procedure Laterality Date  . AORTIC VALVE REPAIR N/A 07/27/2020   Procedure: AORTIC VALVE REPAIR USING HAART 200 AORTIC ANNULOPLASTY DEVICE SIZE ;  Surgeon: Purcell Nails, MD;  Location: [facility] OR;  Service: Open Heart Surgery;  Laterality: N/A;  [place] only  . CARDIAC CATHETERIZATION  1988  . PERCUTANEOUS BALLOON VALVULOPLASTY     pulmonic valve  . status repair of coarctation of the aorta    . TEE WITHOUT CARDIOVERSION N/A 12/23/2019   Procedure: TRANSESOPHAGEAL ECHOCARDIOGRAM (TEE);  Surgeon: Thurmon Fair, MD;  Location: [facility] ENDOSCOPY;  Service: Cardiovascular;  Laterality: N/A;  . TEE WITHOUT CARDIOVERSION N/A 07/27/2020   Procedure: TRANSESOPHAGEAL ECHOCARDIOGRAM (TEE);  Surgeon: Purcell Nails, MD;  Location: [facility] OR;  Service: Open Heart Surgery;  Laterality: N/A;  . WISDOM TOOTH EXTRACTION      Current Medications: Current Meds  Medication Sig  . aspirin EC 325 MG EC tablet Take 1 tablet (325 mg total) by mouth daily.  Marland Kitchen lovastatin (MEVACOR) 40 MG tablet Take 40 mg by mouth daily.   . metoprolol tartrate (LOPRESSOR) 50 MG tablet Take 1 tablet (50 mg total) by mouth 2 (two) times daily.  Marland Kitchen omeprazole (PRILOSEC) 20 MG capsule Take 20 mg by mouth daily.   . traMADol (ULTRAM) 50 MG tablet Take 1 tablet (50 mg total) by mouth every 6 (six) hours as needed for moderate pain.  Marland Kitchen  valsartan-hydrochlorothiazide (DIOVAN-HCT) 160-25 MG tablet Take 1 tablet by mouth daily.      Allergies:   Tamiflu [oseltamivir phosphate]   Social History   Socioeconomic History  . Marital status: Legally Separated    Spouse name: Not on file  . Number of children: Not on file  . Years of education: Not on file  . Highest education level: Not on file  Occupational History  . Not on file  Tobacco Use  . Smoking status: Former Smoker    Quit date: 10/11/2009    Years since quitting: 10.8  .  Smokeless tobacco: Never Used  Vaping Use  . Vaping Use: Never used  Substance and Sexual Activity  . Alcohol use: Not Currently    Comment: Rarely  . Drug use: Never  . Sexual activity: Not on file  Other Topics Concern  . Not on file  Social History Narrative  . Not on file   Social Determinants of Health   Financial Resource Strain:   . Difficulty of Paying Living Expenses: Not on file  Food Insecurity:   . Worried About Programme researcher, broadcasting/film/videounning Out of Food in the Last Year: Not on file  . Ran Out of Food in the Last Year: Not on file  Transportation Needs:   . Lack of Transportation (Medical): Not on file  . Lack of Transportation (Non-Medical): Not on file  Physical Activity:   . Days of Exercise per Week: Not on file  . Minutes of Exercise per Session: Not on file  Stress:   . Feeling of Stress : Not on file  Social Connections:   . Frequency of Communication with Friends and Family: Not on file  . Frequency of Social Gatherings with Friends and Family: Not on file  . Attends Religious Services: Not on file  . Active Member of Clubs or Organizations: Not on file  . Attends BankerClub or Organization Meetings: Not on file  . Marital Status: Not on file     Family History: The patient's family history includes Arthritis in his mother; Diabetes in his father; Hypertension in his father and mother.  ROS:   Review of Systems  Constitution: Negative for decreased appetite, fever and weight gain.  HENT: Negative for congestion, ear discharge, hoarse voice and sore throat.   Eyes: Negative for discharge, redness, vision loss in right eye and visual halos.  Cardiovascular: Negative for chest pain, dyspnea on exertion, leg swelling, orthopnea and palpitations.  Respiratory: Negative for cough, hemoptysis, shortness of breath and snoring.   Endocrine: Negative for heat intolerance and polyphagia.  Hematologic/Lymphatic: Negative for bleeding problem. Does not bruise/bleed easily.  Skin: Negative  for flushing, nail changes, rash and suspicious lesions.  Musculoskeletal: Negative for arthritis, joint pain, muscle cramps, myalgias, neck pain and stiffness.  Gastrointestinal: Negative for abdominal pain, bowel incontinence, diarrhea and excessive appetite.  Genitourinary: Negative for decreased libido, genital sores and incomplete emptying.  Neurological: Negative for brief paralysis, focal weakness, headaches and loss of balance.  Psychiatric/Behavioral: Negative for altered mental status, depression and suicidal ideas.  Allergic/Immunologic: Negative for HIV exposure and persistent infections.    EKGs/Labs/Other Studies Reviewed:    The following studies were reviewed today:   EKG: None today  Recent Labs: 07/25/2020: ALT 53 07/31/2020: Hemoglobin 11.0; Platelets 207 08/02/2020: BUN 15; Creatinine, Ser 0.81; Magnesium 1.8; Potassium 4.0; Sodium 134  Recent Lipid Panel No results found for: CHOL, TRIG, HDL, CHOLHDL, VLDL, LDLCALC, LDLDIRECT  Physical Exam:    VS:  BP 110/80 (  BP Location: Right Arm, Patient Position: Sitting, Cuff Size: Normal)   Pulse 97   Ht 5\' 7"  (1.702 m)   Wt 222 lb (100.7 kg)   SpO2 96%   BMI 34.77 kg/m     Wt Readings from Last 3 Encounters:  08/17/20 222 lb (100.7 kg)  08/02/20 244 lb 1.6 oz (110.7 kg)  07/25/20 237 lb (107.5 kg)     GEN: Well nourished, well developed in no acute distress HEENT: Normal NECK: No JVD; No carotid bruits LYMPHATICS: No lymphadenopathy CARDIAC: S1S2 noted,RRR, no murmurs, rubs, gallops RESPIRATORY:  Clear to auscultation without rales, wheezing or rhonchi  ABDOMEN: Soft, non-tender, non-distended, +bowel sounds, no guarding. EXTREMITIES: No edema, No cyanosis, no clubbing MUSCULOSKELETAL:  No deformity  SKIN: Warm and dry NEUROLOGIC:  Alert and oriented x 3, non-focal PSYCHIATRIC:  Normal affect, good insight  ASSESSMENT:    1. Nonrheumatic aortic valve insufficiency   2. Bicuspid aortic valve   3. S/P  repair of coarctation of aorta   4. S/P aortic valve repair   5. Mixed hyperlipidemia    PLAN:     He appears to be doing well postoperatively.  I am happy with his recovery and so is he.  We discussed his blood pressure and at this time he is going to monitor it daily and if this continue to get high and the heart rate stays in the higher range of asked him to take an additional dose of 25 of metoprolol as needed.    I am really looking forward to reviewing his blood pressure records from home to be able to see his trends.  He does want to do some type of exercise in rehab but due to cost he is unable to register for cardiac rehab.  He is going to discuss with them more to see if there is an insulin option or cheaper options.  He is looking forward to starting some work from home he will discuss with Dr. 07/27/20 when he sees him on Monday.   The patient is in agreement with the above plan. The patient left the office in stable condition.  The patient will follow up in 2 months or sooner if needed.   Medication Adjustments/Labs and Tests Ordered: Current medicines are reviewed at length with the patient today.  Concerns regarding medicines are outlined above.  No orders of the defined types were placed in this encounter.  No orders of the defined types were placed in this encounter.   Patient Instructions  Medication Instructions:  Your physician recommends that you continue on your current medications as directed. Please refer to the Current Medication list given to you today.   *If you need a refill on your cardiac medications before your next appointment, please call your pharmacy*   Lab Work: None.  If you have labs (blood work) drawn today and your tests are completely normal, you will receive your results only by: Tuesday MyChart Message (if you have MyChart) OR . A paper copy in the mail If you have any lab test that is abnormal or we need to change your treatment, we will call  you to review the results.   Testing/Procedures: None.    Follow-Up: At [facility], you and your health needs are our priority.  As part of our continuing mission to provide you with exceptional heart care, we have created designated Provider Care Teams.  These Care Teams include your primary Cardiologist (physician) and Advanced Practice Providers (APPs -  Physician Assistants and Nurse Practitioners) who all work together to provide you with the care you need, when you need it.  We recommend signing up for the patient portal called "MyChart".  Sign up information is provided on this After Visit Summary.  MyChart is used to connect with patients for Virtual Visits (Telemedicine).  Patients are able to view lab/test results, encounter notes, upcoming appointments, etc.  Non-urgent messages can be sent to your provider as well.   To learn more about what you can do with MyChart, go to [email or web address].    Your next appointment:   2 month(s)  The format for your next appointment:   In Person  Provider:   Thomasene Ripple, DO   Other Instructions      Adopting a Healthy Lifestyle.  Know what a healthy weight is for you (roughly BMI <25) and aim to maintain this   Aim for 7+ servings of fruits and vegetables daily   65-80+ fluid ounces of water or unsweet tea for healthy kidneys   Limit to max 1 drink of alcohol per day; avoid smoking/tobacco   Limit animal fats in diet for cholesterol and heart health - choose grass fed whenever available   Avoid highly processed foods, and foods high in saturated/trans fats   Aim for low stress - take time to unwind and care for your mental health   Aim for 150 min of moderate intensity exercise weekly for heart health, and weights twice weekly for bone health   Aim for 7-9 hours of sleep daily   When it comes to diets, agreement about the perfect plan isnt easy to find, even among the experts. Experts at the [facility]  [employer] developed an idea known as the Healthy Eating Plate. Just imagine a plate divided into logical, healthy portions.   The emphasis is on diet quality:   Load up on vegetables and fruits - one-half of your plate: Aim for color and variety, and remember that potatoes dont count.   Go for whole grains - one-quarter of your plate: Whole wheat, barley, wheat berries, quinoa, oats, brown rice, and foods made with them. If you want pasta, go with whole wheat pasta.   Protein power - one-quarter of your plate: Fish, chicken, beans, and nuts are all healthy, versatile protein sources. Limit red meat.   The diet, however, does go beyond the plate, offering a few other suggestions.   Use healthy plant oils, such as olive, canola, soy, corn, sunflower and peanut. Check the labels, and avoid partially hydrogenated oil, which have unhealthy trans fats.   If youre thirsty, drink water. Coffee and tea are good in moderation, but skip sugary drinks and limit milk and dairy products to one or two daily servings.   The type of carbohydrate in the diet is more important than the amount. Some sources of carbohydrates, such as vegetables, fruits, whole grains, and beans-are healthier than others.   Finally, stay active  Signed, Thomasene Ripple, DO  08/17/2020 5:59 PM    [facility]

## 2020-08-22 ENCOUNTER — Ambulatory Visit
Admission: RE | Admit: 2020-08-22 | Discharge: 2020-08-22 | Disposition: A | Discharge status: Home / Self Care | Payer: 59 | Source: Ambulatory Visit | Attending: Thoracic Surgery (Cardiothoracic Vascular Surgery) | Admitting: Thoracic Surgery (Cardiothoracic Vascular Surgery)

## 2020-08-22 ENCOUNTER — Other Ambulatory Visit: Payer: Self-pay

## 2020-08-22 ENCOUNTER — Ambulatory Visit (INDEPENDENT_AMBULATORY_CARE_PROVIDER_SITE_OTHER): Payer: Self-pay | Admitting: Physician Assistant

## 2020-08-22 VITALS — BP 142/86 | HR 82 | Temp 98.0°F | Resp 20 | Ht 67.0 in | Wt 224.6 lb

## 2020-08-22 DIAGNOSIS — Z9889 Other specified postprocedural states: Secondary | ICD-10-CM

## 2020-08-22 MED ORDER — AMOXICILLIN 500 MG PO TABS: 1000.0000 mg | ORAL_TABLET | Freq: Once | ORAL | 12 refills | Status: AC

## 2020-08-22 NOTE — Patient Instructions (Signed)
Endocarditis is a potentially serious infection of heart valves or inside lining of the heart.  It occurs more commonly in patients with diseased heart valves (such as patient's with aortic or mitral valve disease) and in patients who have undergone heart valve repair or replacement.  Certain surgical and dental procedures may put you at risk, such as dental cleaning, other dental procedures, or any surgery involving the respiratory, urinary, gastrointestinal tract, gallbladder or prostate gland.   To minimize your chances for develooping endocarditis, maintain good oral health and seek prompt medical attention for any infections involving the mouth, teeth, gums, skin or urinary tract.    Always notify your doctor or dentist about your underlying heart valve condition before having any invasive procedures. You will need to take antibiotics before certain procedures, including all routine dental cleanings or other dental procedures.  Your cardiologist or dentist should prescribe these antibiotics for you to be taken ahead of time.  You may return to driving an automobile as long as you are no longer requiring oral narcotic pain relievers during the daytime.  It would be wise to start driving only short distances during the daylight and gradually increase from there as you feel comfortable.  Make every effort to maintain a "heart-healthy" lifestyle with regular physical exercise and adherence to a low-fat, low-carbohydrate diet.  Continue to seek regular follow-up appointments with your primary care physician and/or cardiologist.  You may continue to gradually increase your physical activity as tolerated.  Refrain from any heavy lifting or strenuous use of your arms and shoulders until at least 8 weeks from the time of your surgery, and avoid activities that cause increased pain in your chest on the side of your surgical incision.  Otherwise you may continue to increase activities without any particular  limitations.  Increase the intensity and duration of physical activity gradually.          

## 2020-08-22 NOTE — Progress Notes (Signed)
[address]       [place] [address]             [phone]                  Dean Molina [facility] Medical Record #[id number] Date of Birth: 01-16-1976  Referring [id number], Lavona Mound, DO Primary Cardiology: Primary Care:Swayne, Onalee Hua, MD  Chief Complaint:  Follow Up Visit  History of Present Illness:       Mr. Mittman presents today for follow up.  He underwent Aortic Valve Repair on 07/27/2020.  His hospital course was uncomplicated.  Since hospital discharge the patient states that he is doing pretty well.  He has been experiencing some numbness in his pinky and ring finger which have improved since surgery.  He states he isn't sleeping all that well, due to being a side sleeper and he has been trying to sleep on his back or in a recliner.  Activity he is up and ambulating without difficulty.  He does notice that his HR remains elevated at times and is concerned if this will improve.  He has lost some weight and hopes he can continues to lose some more.  He is questioning if he can apply ointments to his surgical incisions.  He is not able to attend cardiac rehabilitation due to cost.   Social History   Tobacco Use  Smoking Status Former Smoker  . Quit date: 10/11/2009  . Years since quitting: 10.8  Smokeless Tobacco Never Used     Allergies  Allergen Reactions  . Tamiflu [Oseltamivir Phosphate] Nausea And Vomiting    Current Outpatient Medications  Medication Sig Dispense Refill  . aspirin EC 325 MG EC tablet Take 1 tablet (325 mg total) by mouth daily.    Marland Kitchen lovastatin (MEVACOR) 40 MG tablet Take 40 mg by mouth daily.     . metoprolol tartrate (LOPRESSOR) 50 MG tablet Take 1 tablet (50 mg total) by mouth 2 (two) times daily. 60 tablet 1  . omeprazole (PRILOSEC) 20 MG capsule Take 20 mg by mouth daily.     . traMADol (ULTRAM) 50 MG tablet Take 1 tablet (50 mg total) by mouth every 6 (six) hours as needed for moderate pain. 28 tablet 0  .  valsartan-hydrochlorothiazide (DIOVAN-HCT) 160-25 MG tablet Take 1 tablet by mouth daily.     Marland Kitchen amoxicillin (AMOXIL) 500 MG tablet Take 2 tablets (1,000 mg total) by mouth once for 1 dose. Prior to dental cleanings, procedures 2 tablet 12   No current facility-administered medications for this visit.   Physical Exam: BP (!) 142/86 (BP Location: Right Arm, Patient Position: Sitting, Cuff Size: Normal)   Pulse 82   Temp 98 F (36.7 C) (Oral)   Resp 20   Ht 5\' 7"  (1.702 m)   Wt 224 lb 9.6 oz (101.9 kg)   SpO2 95% Comment: RA  BMI 35.18 kg/m   General appearance: alert, cooperative and no distress Heart: regular rate and rhythm Lungs: clear to auscultation bilaterally Abdomen: soft, non-tender; bowel sounds normal; no masses,  no organomegaly Extremities: extremities normal, atraumatic, no cyanosis or edema Wound: clean and dry, some eschar remains present on CT sites   Diagnostic Studies & Laboratory data:         Recent Radiology Findings: DG Chest 2 View  Result Date: 08/22/2020 CLINICAL DATA:  Aortic valve repair 07/27/2020 EXAM: CHEST - 2 VIEW COMPARISON:  07/30/2020 FINDINGS: Aortic valve replacement. Heart size  and vascularity normal. Interval clearing of bibasilar atelectasis and small pleural effusion. Lungs are now clear. No pleural effusion. Negative for heart failure. Chronic left rib fractures. IMPRESSION: No active cardiopulmonary disease. Electronically Signed   By: Marlan Palau M.D.   On: 08/22/2020 12:39     I have independently reviewed the above radiology findings and reviewed findings  with the patient.  Recent Labs: Lab Results  Component Value Date   WBC 9.1 07/31/2020   HGB 11.0 (L) 07/31/2020   HCT 34.3 (L) 07/31/2020   PLT 207 07/31/2020   GLUCOSE 213 (H) 08/02/2020   ALT 53 (H) 07/25/2020   AST 36 07/25/2020   NA 134 (L) 08/02/2020   K 4.0 08/02/2020   CL 96 (L) 08/02/2020   CREATININE 0.81 08/02/2020   BUN 15 08/02/2020   CO2 27 08/02/2020    INR 1.4 (H) 07/27/2020   HGBA1C 7.2 (H) 07/25/2020    Assessment / Plan:      1. S/P AV Repair- overall doing very well.  He remains tachycardic at times, which should improve as the patient gets further out from surgery 2. Pulm- no acute issues, some residual atelectasis on CXR, patient will resume use of IS 3. Activity- patient may increase activity as tolerated, he should refrain from lifting >10bs for the next several weeks.  He may resume driving short distances.  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  He was given ABX RX for dental cleanings/procedures 4. He is scheduled for repeat Echocardiogram in November, he will return to clinic with Dr. Cornelius Moras in 3 months time or sooner if needed.  Lowella Dandy, PA-C 08/22/2020 1:54 PM

## 2020-08-24 ENCOUNTER — Other Ambulatory Visit: Payer: Self-pay | Admitting: Surgical

## 2020-09-13 ENCOUNTER — Other Ambulatory Visit: Payer: Self-pay

## 2020-09-13 ENCOUNTER — Ambulatory Visit (HOSPITAL_COMMUNITY): Payer: 59 | Attending: Cardiology

## 2020-09-13 DIAGNOSIS — Z954 Presence of other heart-valve replacement: Secondary | ICD-10-CM | POA: Diagnosis not present

## 2020-09-13 DIAGNOSIS — Z9889 Other specified postprocedural states: Secondary | ICD-10-CM | POA: Diagnosis present

## 2020-09-13 DIAGNOSIS — I350 Nonrheumatic aortic (valve) stenosis: Secondary | ICD-10-CM | POA: Insufficient documentation

## 2020-09-13 LAB — ECHOCARDIOGRAM COMPLETE
AV Mean grad: 18 mmHg
AV Peak grad: 22.5 mmHg
Ao pk vel: 2.37 m/s
Area-P 1/2: 3.53 cm2
S' Lateral: 3.4 cm

## 2020-09-13 MED ORDER — PERFLUTREN LIPID MICROSPHERE
1.0000 mL | INTRAVENOUS | Status: AC | PRN
  Administered 2020-09-13: 2 mL via INTRAVENOUS

## 2020-10-03 DIAGNOSIS — Z8679 Personal history of other diseases of the circulatory system: Secondary | ICD-10-CM | POA: Insufficient documentation

## 2020-10-03 DIAGNOSIS — E785 Hyperlipidemia, unspecified: Secondary | ICD-10-CM | POA: Insufficient documentation

## 2020-10-03 DIAGNOSIS — R011 Cardiac murmur, unspecified: Secondary | ICD-10-CM | POA: Insufficient documentation

## 2020-10-03 DIAGNOSIS — E669 Obesity, unspecified: Secondary | ICD-10-CM | POA: Insufficient documentation

## 2020-10-03 DIAGNOSIS — Q231 Congenital insufficiency of aortic valve: Secondary | ICD-10-CM | POA: Insufficient documentation

## 2020-10-03 DIAGNOSIS — K219 Gastro-esophageal reflux disease without esophagitis: Secondary | ICD-10-CM | POA: Insufficient documentation

## 2020-10-03 DIAGNOSIS — I10 Essential (primary) hypertension: Secondary | ICD-10-CM | POA: Insufficient documentation

## 2020-10-04 ENCOUNTER — Ambulatory Visit: Payer: 59 | Admitting: Cardiology

## 2020-10-25 ENCOUNTER — Other Ambulatory Visit: Payer: Self-pay

## 2020-10-25 ENCOUNTER — Encounter: Payer: Self-pay | Admitting: Cardiology

## 2020-10-25 ENCOUNTER — Ambulatory Visit (INDEPENDENT_AMBULATORY_CARE_PROVIDER_SITE_OTHER): Payer: 59 | Admitting: Cardiology

## 2020-10-25 VITALS — BP 124/82 | HR 65 | Ht 67.0 in | Wt 231.1 lb

## 2020-10-25 DIAGNOSIS — I351 Nonrheumatic aortic (valve) insufficiency: Secondary | ICD-10-CM | POA: Diagnosis not present

## 2020-10-25 DIAGNOSIS — Q231 Congenital insufficiency of aortic valve: Secondary | ICD-10-CM

## 2020-10-25 DIAGNOSIS — I10 Essential (primary) hypertension: Secondary | ICD-10-CM | POA: Diagnosis not present

## 2020-10-25 DIAGNOSIS — Q251 Coarctation of aorta: Secondary | ICD-10-CM

## 2020-10-25 DIAGNOSIS — Z8774 Personal history of (corrected) congenital malformations of heart and circulatory system: Secondary | ICD-10-CM

## 2020-10-25 NOTE — Patient Instructions (Signed)
Medication Instructions:  Your physician recommends that you continue on your current medications as directed. Please refer to the Current Medication list given to you today.  *If you need a refill on your cardiac medications before your next appointment, please call your pharmacy*   Lab Work: none If you have labs (blood work) drawn today and your tests are completely normal, you will receive your results only by: Marland Kitchen MyChart Message (if you have MyChart) OR . A paper copy in the mail If you have any lab test that is abnormal or we need to change your treatment, we will call you to review the results.   Testing/Procedures: none   Follow-Up: At [facility], you and your health needs are our priority.  As part of our continuing mission to provide you with exceptional heart care, we have created designated Provider Care Teams.  These Care Teams include your primary Cardiologist (physician) and Advanced Practice Providers (APPs -  Physician Assistants and Nurse Practitioners) who all work together to provide you with the care you need, when you need it.  We recommend signing up for the patient portal called "MyChart".  Sign up information is provided on this After Visit Summary.  MyChart is used to connect with patients for Virtual Visits (Telemedicine).  Patients are able to view lab/test results, encounter notes, upcoming appointments, etc.  Non-urgent messages can be sent to your provider as well.   To learn more about what you can do with MyChart, go to [email or web address].    Your next appointment:   12 month(s)  The format for your next appointment:   In Person  Provider:   Thomasene Ripple, DO   Other Instructions

## 2020-10-25 NOTE — Progress Notes (Signed)
Cardiology Office Note:    Date:  10/25/2020   ID:  Dean Molina, DOB 1976/04/12, MRN [id number]  PCP:  Tally Joe, MD  Cardiologist:  Thomasene Ripple, DO  Electrophysiologist:  None   Referring MD: Tally Joe, MD   " I am doing well"   History of Present Illness:    Dean Molina is a 44 y.o. male with a hx of hypertension, bicuspid aortic valve with severe aortic regurgitation status post aortic valve repair with biostable HAART aortic ring anoplasty, complex valvuloplasty including release of single raphe free edge plication repair of bileaflet prolapse on July 27, 2020, status post coarctation repair when the patient was 18 months, congenital pulmonic stenosis status post balloon valvuloplasty at age 59,obesityand hyperlipidemia.  I last saw the patient on August 17, 2020 at that time he seems to be improving from his surgery. He was happy with his recovery.  In the interim the patient did get his echocardiogram done and we have previously shared the results with this patient.  He did see CT surgery and he has been cleared for work and cleared for driving short distances. He is here today for follow-up visit. He offers no complaints at this time.   Past Medical History:  Diagnosis Date  . Acid reflux   . Aortic insufficiency due to bicuspid aortic valve   . Bicuspid aortic valve   . Coarctation of the aorta, s/p repair   . Essential hypertension 10/12/2019  . GERD (gastroesophageal reflux disease)   . Heart murmur   . Hyperlipidemia   . Hypertension   . Mixed hyperlipidemia 10/12/2019  . Nonrheumatic aortic valve insufficiency 10/12/2019  . Obesity (BMI 30-39.9)   . pulmonic valve stenosis s/p balloon valvuloplasty   . S/P aortic valve repair 07/27/2020   Complex valvuloplasty including release of raphe, free edge plication prolapse repair, and 23 mm HAART Biostable ring annuloplasty  . S/P repair of coarctation of aorta     Past Surgical History:   Procedure Laterality Date  . AORTIC VALVE REPAIR N/A 07/27/2020   Procedure: AORTIC VALVE REPAIR USING HAART 200 AORTIC ANNULOPLASTY DEVICE SIZE ;  Surgeon: Purcell Nails, MD;  Location: [facility];  Service: Open Heart Surgery;  Laterality: N/A;  [place] only  . CARDIAC CATHETERIZATION  1988  . PERCUTANEOUS BALLOON VALVULOPLASTY     pulmonic valve  . status repair of coarctation of the aorta    . TEE WITHOUT CARDIOVERSION N/A 12/23/2019   Procedure: TRANSESOPHAGEAL ECHOCARDIOGRAM (TEE);  Surgeon: Thurmon Fair, MD;  Location: [facility] ENDOSCOPY;  Service: Cardiovascular;  Laterality: N/A;  . TEE WITHOUT CARDIOVERSION N/A 07/27/2020   Procedure: TRANSESOPHAGEAL ECHOCARDIOGRAM (TEE);  Surgeon: Purcell Nails, MD;  Location: [facility] OR;  Service: Open Heart Surgery;  Laterality: N/A;  . WISDOM TOOTH EXTRACTION      Current Medications: Current Meds  Medication Sig  . aspirin EC 325 MG EC tablet Take 1 tablet (325 mg total) by mouth daily.  Marland Kitchen lovastatin (MEVACOR) 40 MG tablet Take 40 mg by mouth daily.   . metoprolol tartrate (LOPRESSOR) 50 MG tablet Take 1 tablet (50 mg total) by mouth 2 (two) times daily.  Marland Kitchen omeprazole (PRILOSEC) 20 MG capsule Take 20 mg by mouth daily.   . traMADol (ULTRAM) 50 MG tablet Take 1 tablet (50 mg total) by mouth every 6 (six) hours as needed for moderate pain.  . valsartan-hydrochlorothiazide (DIOVAN-HCT) 160-25 MG tablet Take 1 tablet by mouth daily.      Allergies:  Tamiflu [oseltamivir phosphate]   Social History   Socioeconomic History  . Marital status: Legally Separated    Spouse name: Not on file  . Number of children: Not on file  . Years of education: Not on file  . Highest education level: Not on file  Occupational History  . Not on file  Tobacco Use  . Smoking status: Former Smoker    Quit date: 10/11/2009    Years since quitting: 11.0  . Smokeless tobacco: Never Used  Vaping Use  . Vaping Use: Never used  Substance and Sexual Activity  .  Alcohol use: Not Currently    Comment: Rarely  . Drug use: Never  . Sexual activity: Not on file  Other Topics Concern  . Not on file  Social History Narrative  . Not on file   Social Determinants of Health   Financial Resource Strain: Not on file  Food Insecurity: Not on file  Transportation Needs: Not on file  Physical Activity: Not on file  Stress: Not on file  Social Connections: Not on file     Family History: The patient's family history includes Arthritis in his mother; Diabetes in his father; Hypertension in his father and mother.  ROS:   Review of Systems  Constitution: Negative for decreased appetite, fever and weight gain.  HENT: Negative for congestion, ear discharge, hoarse voice and sore throat.   Eyes: Negative for discharge, redness, vision loss in right eye and visual halos.  Cardiovascular: Negative for chest pain, dyspnea on exertion, leg swelling, orthopnea and palpitations.  Respiratory: Negative for cough, hemoptysis, shortness of breath and snoring.   Endocrine: Negative for heat intolerance and polyphagia.  Hematologic/Lymphatic: Negative for bleeding problem. Does not bruise/bleed easily.  Skin: Negative for flushing, nail changes, rash and suspicious lesions.  Musculoskeletal: Negative for arthritis, joint pain, muscle cramps, myalgias, neck pain and stiffness.  Gastrointestinal: Negative for abdominal pain, bowel incontinence, diarrhea and excessive appetite.  Genitourinary: Negative for decreased libido, genital sores and incomplete emptying.  Neurological: Negative for brief paralysis, focal weakness, headaches and loss of balance.  Psychiatric/Behavioral: Negative for altered mental status, depression and suicidal ideas.  Allergic/Immunologic: Negative for HIV exposure and persistent infections.    EKGs/Labs/Other Studies Reviewed:    The following studies were reviewed today:   EKG: None today  Coronary CTA IMPRESSIONS    1. Left  ventricular ejection fraction, by estimation, is 55 to 60%. The  left ventricle has normal function. The left ventricle has no regional  wall motion abnormalities. There is mild concentric left ventricular  hypertrophy. Left ventricular diastolic  parameters are consistent with Grade I diastolic dysfunction (impaired  relaxation). Elevated left atrial pressure.  2. Right ventricular systolic function is normal. The right ventricular  size is normal.  3. Left atrial size was mildly dilated.  4. The mitral valve is normal in structure. Mild mitral valve  regurgitation. No evidence of mitral stenosis.  5. The aortic valve has been repaired/replaced. Aortic valve  regurgitation is trivial. No aortic stenosis is present. There is a 23 mm  HAART Biostable Ring valve present in the aortic position. Procedure Date:  07/27/2020. Echo findings are consistent  with normal structure and function of the aortic valve prosthesis. Aortic  valve mean gradient measures 18.0 mmHg.  6. The inferior vena cava is normal in size with greater than 50%  respiratory variability, suggesting right atrial pressure of 3 mmHg.   FINDINGS  Left Ventricle: Left ventricular ejection fraction, by estimation,  is 55  to 60%. The left ventricle has normal function. The left ventricle has no  regional wall motion abnormalities. Definity contrast agent was given IV  to delineate the left ventricular  endocardial borders. The left ventricular internal cavity size was normal  in size. There is mild concentric left ventricular hypertrophy. Left  ventricular diastolic parameters are consistent with Grade I diastolic  dysfunction (impaired relaxation).  Elevated left atrial pressure.   Right Ventricle: The right ventricular size is normal. No increase in  right ventricular wall thickness. Right ventricular systolic function is  normal.   Left Atrium: Left atrial size was mildly dilated.   Right Atrium: Right atrial  size was normal in size.   Pericardium: There is no evidence of pericardial effusion.   Mitral Valve: The mitral valve is normal in structure. Mild mitral valve  regurgitation. No evidence of mitral valve stenosis.   Tricuspid Valve: The tricuspid valve is normal in structure. Tricuspid  valve regurgitation is not demonstrated. No evidence of tricuspid  stenosis.   Aortic Valve: The aortic valve has been repaired/replaced. Aortic valve  regurgitation is trivial. No aortic stenosis is present. Aortic valve mean  gradient measures 18.0 mmHg. Aortic valve peak gradient measures 22.5  mmHg. There is a 23 mm HAART  Biostable Ring valve present in the aortic position. Procedure Date:  07/27/2020. Echo findings are consistent with normal structure and function  of the aortic valve prosthesis.   Pulmonic Valve: The pulmonic valve was normal in structure. Pulmonic valve  regurgitation is not visualized. No evidence of pulmonic stenosis.   Aorta: The aortic root is normal in size and structure.   Venous: The inferior vena cava is normal in size with greater than 50%  respiratory variability, suggesting right atrial pressure of 3 mmHg.   IAS/Shunts: No atrial level shunt detected by color flow Doppler.      Recent Labs: 07/25/2020: ALT 53 07/31/2020: Hemoglobin 11.0; Platelets 207 08/02/2020: BUN 15; Creatinine, Ser 0.81; Magnesium 1.8; Potassium 4.0; Sodium 134  Recent Lipid Panel No results found for: CHOL, TRIG, HDL, CHOLHDL, VLDL, LDLCALC, LDLDIRECT  Physical Exam:    VS:  BP 124/82   Pulse 65   Ht 5\' 7"  (1.702 m)   Wt 231 lb 1.6 oz (104.8 kg)   SpO2 98%   BMI 36.20 kg/m     Wt Readings from Last 3 Encounters:  10/25/20 231 lb 1.6 oz (104.8 kg)  08/22/20 224 lb 9.6 oz (101.9 kg)  08/17/20 222 lb (100.7 kg)     GEN: Well nourished, well developed in no acute distress HEENT: Normal NECK: No JVD; No carotid bruits LYMPHATICS: No lymphadenopathy CARDIAC: S1S2 noted,RRR, no  murmurs, rubs, gallops RESPIRATORY:  Clear to auscultation without rales, wheezing or rhonchi  ABDOMEN: Soft, non-tender, non-distended, +bowel sounds, no guarding. EXTREMITIES: No edema, No cyanosis, no clubbing MUSCULOSKELETAL:  No deformity  SKIN: Warm and dry NEUROLOGIC:  Alert and oriented x 3, non-focal PSYCHIATRIC:  Normal affect, good insight  ASSESSMENT:    1. Nonrheumatic aortic valve insufficiency   2. Bicuspid aortic valve   3. Coarctation of the aorta, complex   4. Essential hypertension   5. Primary hypertension   6. S/P repair of coarctation of aorta    PLAN:       He appears to be doing well from a cardiovascular standpoint. He is back to his baseline he is enjoying his activity I'm very happy for the patient.  His tachycardia has improved significantly  we will continue patient on his current dose of Lopressor.  He is on aspirin 325 I like to transition him down to aspirin 81 mg daily.  I advised the patient that moving forward for dental cleanings and procedures he will need prophylaxis antibiotics.  Expresses understanding and tells me he currently has amoxicillin which was prescribed by CT surgery for his upcoming March dental cleaning.  The patient understands the need to lose weight with diet and exercise. We have discussed specific strategies for this.  His blood pressure is acceptable no changes will be made at this time.  The patient is in agreement with the above plan. The patient left the office in stable condition.  The patient will follow up in 12 months or sooner if needed.   Medication Adjustments/Labs and Tests Ordered: Current medicines are reviewed at length with the patient today.  Concerns regarding medicines are outlined above.  No orders of the defined types were placed in this encounter.  No orders of the defined types were placed in this encounter.   Patient Instructions  Medication Instructions:  Your physician recommends that you  continue on your current medications as directed. Please refer to the Current Medication list given to you today.  *If you need a refill on your cardiac medications before your next appointment, please call your pharmacy*   Lab Work: none If you have labs (blood work) drawn today and your tests are completely normal, you will receive your results only by: Marland Kitchen MyChart Message (if you have MyChart) OR . A paper copy in the mail If you have any lab test that is abnormal or we need to change your treatment, we will call you to review the results.   Testing/Procedures: none   Follow-Up: At [facility], you and your health needs are our priority.  As part of our continuing mission to provide you with exceptional heart care, we have created designated Provider Care Teams.  These Care Teams include your primary Cardiologist (physician) and Advanced Practice Providers (APPs -  Physician Assistants and Nurse Practitioners) who all work together to provide you with the care you need, when you need it.  We recommend signing up for the patient portal called "MyChart".  Sign up information is provided on this After Visit Summary.  MyChart is used to connect with patients for Virtual Visits (Telemedicine).  Patients are able to view lab/test results, encounter notes, upcoming appointments, etc.  Non-urgent messages can be sent to your provider as well.   To learn more about what you can do with MyChart, go to [email or web address].    Your next appointment:   12 month(s)  The format for your next appointment:   In Person  Provider:   Thomasene Ripple, DO   Other Instructions      Adopting a Healthy Lifestyle.  Know what a healthy weight is for you (roughly BMI <25) and aim to maintain this   Aim for 7+ servings of fruits and vegetables daily   65-80+ fluid ounces of water or unsweet tea for healthy kidneys   Limit to max 1 drink of alcohol per day; avoid smoking/tobacco   Limit animal  fats in diet for cholesterol and heart health - choose grass fed whenever available   Avoid highly processed foods, and foods high in saturated/trans fats   Aim for low stress - take time to unwind and care for your mental health   Aim for 150 min of moderate intensity exercise weekly for heart  health, and weights twice weekly for bone health   Aim for 7-9 hours of sleep daily   When it comes to diets, agreement about the perfect plan isnt easy to find, even among the experts. Experts at the [facility] [employer] developed an idea known as the Healthy Eating Plate. Just imagine a plate divided into logical, healthy portions.   The emphasis is on diet quality:   Load up on vegetables and fruits - one-half of your plate: Aim for color and variety, and remember that potatoes dont count.   Go for whole grains - one-quarter of your plate: Whole wheat, barley, wheat berries, quinoa, oats, brown rice, and foods made with them. If you want pasta, go with whole wheat pasta.   Protein power - one-quarter of your plate: Fish, chicken, beans, and nuts are all healthy, versatile protein sources. Limit red meat.   The diet, however, does go beyond the plate, offering a few other suggestions.   Use healthy plant oils, such as olive, canola, soy, corn, sunflower and peanut. Check the labels, and avoid partially hydrogenated oil, which have unhealthy trans fats.   If youre thirsty, drink water. Coffee and tea are good in moderation, but skip sugary drinks and limit milk and dairy products to one or two daily servings.   The type of carbohydrate in the diet is more important than the amount. Some sources of carbohydrates, such as vegetables, fruits, whole grains, and beans-are healthier than others.   Finally, stay active  Signed, Thomasene Ripple, DO  10/25/2020 9:58 AM    [facility]

## 2020-11-18 ENCOUNTER — Telehealth: Payer: 59 | Admitting: Thoracic Surgery (Cardiothoracic Vascular Surgery)

## 2020-11-21 ENCOUNTER — Encounter: Payer: Self-pay | Admitting: Thoracic Surgery (Cardiothoracic Vascular Surgery)

## 2020-11-21 ENCOUNTER — Other Ambulatory Visit: Payer: Self-pay

## 2020-11-21 ENCOUNTER — Telehealth (INDEPENDENT_AMBULATORY_CARE_PROVIDER_SITE_OTHER): Payer: 59 | Admitting: Thoracic Surgery (Cardiothoracic Vascular Surgery)

## 2020-11-21 ENCOUNTER — Encounter: Payer: 59 | Admitting: Thoracic Surgery (Cardiothoracic Vascular Surgery)

## 2020-11-21 DIAGNOSIS — E785 Hyperlipidemia, unspecified: Secondary | ICD-10-CM | POA: Diagnosis not present

## 2020-11-21 DIAGNOSIS — Z9889 Other specified postprocedural states: Secondary | ICD-10-CM | POA: Diagnosis not present

## 2020-11-21 DIAGNOSIS — I10 Essential (primary) hypertension: Secondary | ICD-10-CM

## 2020-11-21 DIAGNOSIS — Z8774 Personal history of (corrected) congenital malformations of heart and circulatory system: Secondary | ICD-10-CM | POA: Diagnosis not present

## 2020-11-21 NOTE — Progress Notes (Signed)
[address]       [name] [address]             [phone]     CARDIOTHORACIC SURGERY TELEPHONE VIRTUAL OFFICE NOTE  Referring Provider is Tally Joe, MD Primary Cardiologist is Thomasene Ripple, DO PCP is Tally Joe, MD   HPI:  I spoke with Dean Molina (DOB 12/13/1975 ) via telephone on 11/21/2020 at 11:56 AM and verified that I was speaking with the correct person using more than one form of identification.  We discussed the fact that I was contacting them from my office and they were located at home, as well as the reason(s) for conducting our visit virtually instead of in-person.  The patient expressed understanding the circumstances and agreed to proceed as described.  Patient is a 45 year old moderately obese male with history of congenital heart disease,hypertension, hyperlipidemia, and GE reflux disease who underwent aortic valve repair on July 27, 2020 for Sievers type I bicuspid aortic valve with severe asymptomatic aortic insufficiency.  His early postoperative recovery was uneventful and he was last seen here in our office on August 22, 2020 at which time he was doing well.  Since then he underwent routine follow-up transthoracic echocardiogram on September 13, 2020.  Echocardiogram revealed intact aortic valve repair with "trivial" residual aortic insufficiency and no significant aortic stenosis.  Mean transvalvular gradient across the aortic valve was estimated 18 mmHg.  Left ventricular systolic function appeared normal.  I spoke with patient over the telephone today.  He states that he has essentially completely recovered from his surgery and he no longer has any significant pain in his chest.  He has back to normal unrestricted physical activity.  He denies any symptoms of exertional shortness of breath or chest discomfort.  Although he was essentially asymptomatic prior to surgery he does report that since surgery he has noticed that when he is  doing things that are more strenuous, such as going up a flight of stairs, his heart rate does not increase as rapidly as it used to in the past.  He is very pleased with his outcome.  We have not recommended any changes to the patient's current medications.  The patient has been reminded regarding the importance of dental hygiene and the lifelong need for antibiotic prophylaxis for all dental cleanings and other related invasive procedures.    Current Outpatient Medications  Medication Sig Dispense Refill  . aspirin EC 325 MG EC tablet Take 1 tablet (325 mg total) by mouth daily.    Marland Kitchen lovastatin (MEVACOR) 40 MG tablet Take 40 mg by mouth daily.     . metoprolol tartrate (LOPRESSOR) 50 MG tablet Take 1 tablet (50 mg total) by mouth 2 (two) times daily. 60 tablet 1  . omeprazole (PRILOSEC) 20 MG capsule Take 20 mg by mouth daily.     . traMADol (ULTRAM) 50 MG tablet Take 1 tablet (50 mg total) by mouth every 6 (six) hours as needed for moderate pain. 28 tablet 0  . valsartan-hydrochlorothiazide (DIOVAN-HCT) 160-25 MG tablet Take 1 tablet by mouth daily.      No current facility-administered medications for this visit.     Diagnostic Tests:   ECHOCARDIOGRAM REPORT       Patient Name:  Dean Molina Date of Exam: 09/13/2020  Medical Rec #: [id number]   Height:    67.0 in  Accession #:  [id number]   Weight:    224.6 lb  Date of  Birth: [birth year]   BSA:     2.124 m  Patient Age:  44 years    BP:      142/86 mmHg  Patient Gender: M       HR:      79 bpm.  Exam Location: [address]   Procedure: 2D Echo, Cardiac Doppler, Color Doppler and Intracardiac       Opacification Agent   Indications:  I35.9 Aortic valve disorder    History:    Patient has prior history of Echocardiogram examinations,  most         recent 07/27/2020. Status post Aortic valve  Valvuloplasty-11mm         HAART Biostable ring;  Remote Pulmonic Valve balloon         Valvuloplasty. Remote Coarctation repair.; Risk         Factors:Hypertension, Dyslipidemia and Obesity. History of         Pulmonic stenosis, coarctation , bicuspid aortic Valve.  Murmur.         Aortic Valve: 23 mm HAART Biostable Ring valve is present  in the         aortic position. Procedure Date: 07/27/2020.    Sonographer:  Daphine Deutscher RDCS  Referring Phys: [id number] KARDIE TOBB     Sonographer Comments: Technically difficult study due to poor echo  windows.  IMPRESSIONS    1. Left ventricular ejection fraction, by estimation, is 55 to 60%. The  left ventricle has normal function. The left ventricle has no regional  wall motion abnormalities. There is mild concentric left ventricular  hypertrophy. Left ventricular diastolic  parameters are consistent with Grade I diastolic dysfunction (impaired  relaxation). Elevated left atrial pressure.  2. Right ventricular systolic function is normal. The right ventricular  size is normal.  3. Left atrial size was mildly dilated.  4. The mitral valve is normal in structure. Mild mitral valve  regurgitation. No evidence of mitral stenosis.  5. The aortic valve has been repaired/replaced. Aortic valve  regurgitation is trivial. No aortic stenosis is present. There is a 23 mm  HAART Biostable Ring valve present in the aortic position. Procedure Date:  07/27/2020. Echo findings are consistent  with normal structure and function of the aortic valve prosthesis. Aortic  valve mean gradient measures 18.0 mmHg.  6. The inferior vena cava is normal in size with greater than 50%  respiratory variability, suggesting right atrial pressure of 3 mmHg.   FINDINGS  Left Ventricle: Left ventricular ejection fraction, by estimation, is 55  to 60%. The left ventricle has normal function. The left ventricle has no  regional wall motion abnormalities.  Definity contrast agent was given IV  to delineate the left ventricular  endocardial borders. The left ventricular internal cavity size was normal  in size. There is mild concentric left ventricular hypertrophy. Left  ventricular diastolic parameters are consistent with Grade I diastolic  dysfunction (impaired relaxation).  Elevated left atrial pressure.   Right Ventricle: The right ventricular size is normal. No increase in  right ventricular wall thickness. Right ventricular systolic function is  normal.   Left Atrium: Left atrial size was mildly dilated.   Right Atrium: Right atrial size was normal in size.   Pericardium: There is no evidence of pericardial effusion.   Mitral Valve: The mitral valve is normal in structure. Mild mitral valve  regurgitation. No evidence of mitral valve stenosis.   Tricuspid Valve: The tricuspid valve is normal in structure. Tricuspid  valve regurgitation  is not demonstrated. No evidence of tricuspid  stenosis.   Aortic Valve: The aortic valve has been repaired/replaced. Aortic valve  regurgitation is trivial. No aortic stenosis is present. Aortic valve mean  gradient measures 18.0 mmHg. Aortic valve peak gradient measures 22.5  mmHg. There is a 23 mm HAART  Biostable Ring valve present in the aortic position. Procedure Date:  07/27/2020. Echo findings are consistent with normal structure and function  of the aortic valve prosthesis.   Pulmonic Valve: The pulmonic valve was normal in structure. Pulmonic valve  regurgitation is not visualized. No evidence of pulmonic stenosis.   Aorta: The aortic root is normal in size and structure.   Venous: The inferior vena cava is normal in size with greater than 50%  respiratory variability, suggesting right atrial pressure of 3 mmHg.   IAS/Shunts: No atrial level shunt detected by color flow Doppler.     LEFT VENTRICLE  PLAX 2D  LVIDd:     4.80 cm Diastology  LVIDs:     3.40 cm LV e'  medial:  6.22 cm/s  LV PW:     1.00 cm LV E/e' medial: 16.6  LV IVS:    1.10 cm LV e' lateral:  10.10 cm/s             LV E/e' lateral: 10.2     RIGHT VENTRICLE  RV Basal diam: 3.40 cm  RV S prime:   9.01 cm/s  TAPSE (M-mode): 1.0 cm   LEFT ATRIUM       Index    RIGHT ATRIUM      Index  LA diam:    4.60 cm 2.17 cm/m RA Area:   13.00 cm  LA Vol (A2C):  78.5 ml 36.95 ml/m RA Volume:  32.50 ml 15.30 ml/m  LA Vol (A4C):  43.0 ml 20.24 ml/m  LA Biplane Vol: 60.0 ml 28.24 ml/m  AORTIC VALVE          PULMONIC VALVE  AV Vmax:      237.40 cm/s PV Vmax:    1.12 m/s  AV Vmean:     182.600 cm/s PV Vmean:    77.800 cm/s  AV VTI:      0.411 m   PV VTI:     0.215 m  AV Peak Grad:   22.5 mmHg  PV Peak grad:  5.1 mmHg  AV Mean Grad:   18.0 mmHg  PV Mean grad:  3.0 mmHg  LVOT Vmax:     86.25 cm/s  RVOT Peak grad: 3 mmHg  LVOT Vmean:    64.450 cm/s  LVOT VTI:     0.162 m  LVOT/AV VTI ratio: 0.40    AORTA  Ao Root diam: 3.50 cm   MITRAL VALVE  MV Area (PHT): 3.53 cm   SHUNTS  MV Decel Time: 215 msec   Systemic VTI: 0.16 m  MV E velocity: 103.00 cm/s Pulmonic VTI: 0.175 m  MV A velocity: 114.00 cm/s  MV E/A ratio: 0.90   Tobias Alexander MD  Electronically signed by Tobias Alexander MD  Signature Date/Time: 09/13/2020/3:25:01 PM      Impression:  Patient is doing very well nearly 4 months status post aortic valve repair.  Plan:  Patient will continue to follow-up on an annual basis with Dr. Servando Salina.  I would recommend follow-up transthoracic echocardiogram be performed at least every other year if not annually.  Patient will return to our office for routine follow-up next fall, approximately 1 year following his  surgery.  All questions answered.    I discussed limitations of evaluation and management via telephone.  The patient was advised to call back for  repeat telephone consultation or to seek an in-person evaluation if questions arise or the patient's clinical condition changes in any significant manner.  I spent in excess of 15 minutes of non-face-to-face time during the conduct of this telephone virtual office consultation, including pre-visit review of the patient's records and direct conversation with the patient.      Salvatore Decentlarence H. Cornelius Moraswen, MD 11/21/2020 11:56 AM

## 2021-03-30 IMAGING — CT CT HEART MORP W/ CTA COR W/ SCORE W/ CA W/CM &/OR W/O CM
1 of 2 series · 8 of 20 positions shown, 10 images · non-contrast
Comparison: None.
COMPARISON: None.

Addendum:
EXAM:
OVER-READ INTERPRETATION  CT CHEST

The following report is an over-read performed by radiologist Dr.
Rajbira Pilava [REDACTED] on 12/17/2019. This over-read
does not include interpretation of cardiac or coronary anatomy or
pathology. The coronary CTA interpretation by the cardiologist is
attached.
CLINICAL DATA: Bicupsid aortic valve with severe aortic
regurgitation. Aortic coarctation repair at 18 months. Congential
pulmonic stenosis s/p balloon valvuloplasty. History of obtained
from recent cardiology visit. Surgical reports of congenital repairs
were not available.
Cardiac TAVR CT
TECHNIQUE: The patient was scanned on a Phillips Force scanner. A 120 kV
retrospective scan was triggered in the descending thoracic aorta at
111 HU's. Gantry rotation speed was 250 msecs and collimation was .6
mm. No beta blockade or nitro were given. The 3D data set was
reconstructed in 5% intervals of the R-R cycle. Systolic and
diastolic phases were analyzed on a dedicated work station using
MPR, MIP and VRT modes. The patient received 80 cc of contrast.

[Series 233: findings · 0.40mm/px · 8 of 10 slices shown, 10 images]
[im 2/10  vessel]
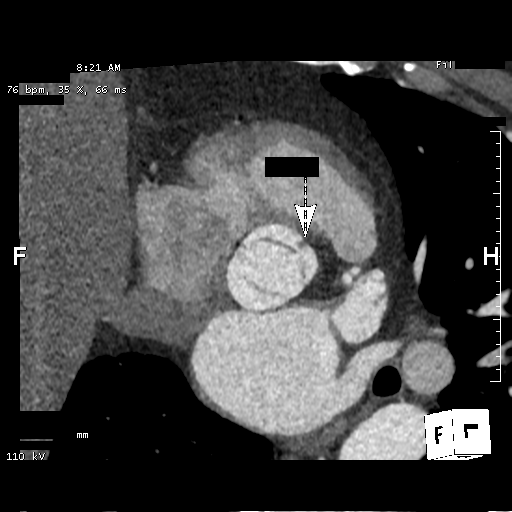
[im 2/10  lung]
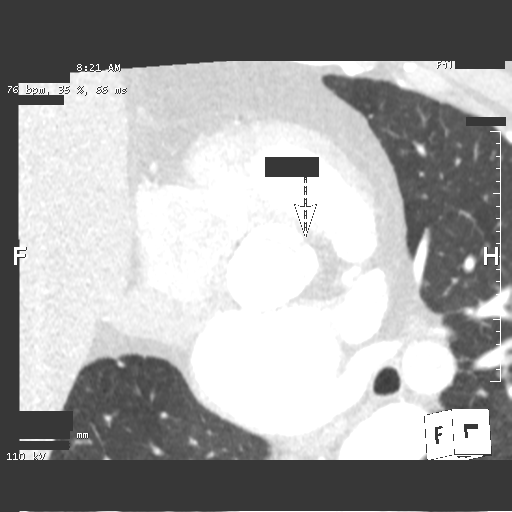
[im 3/10  vessel]
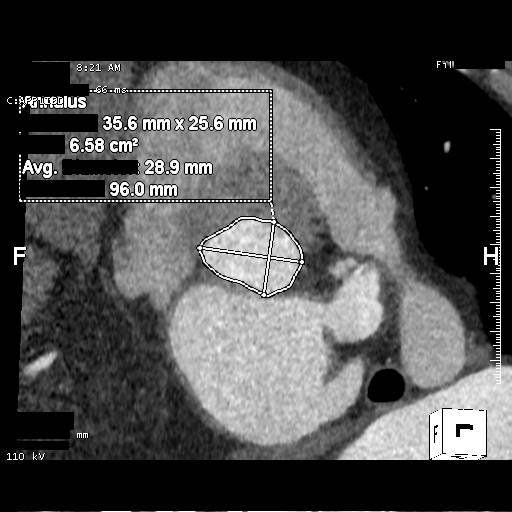
[im 4/10  vessel]
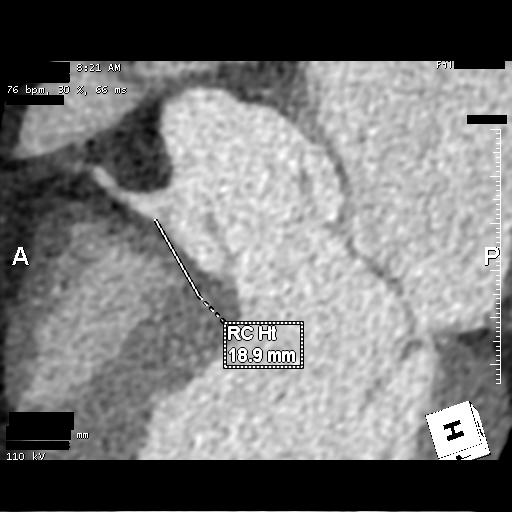
[im 5/10  vessel]
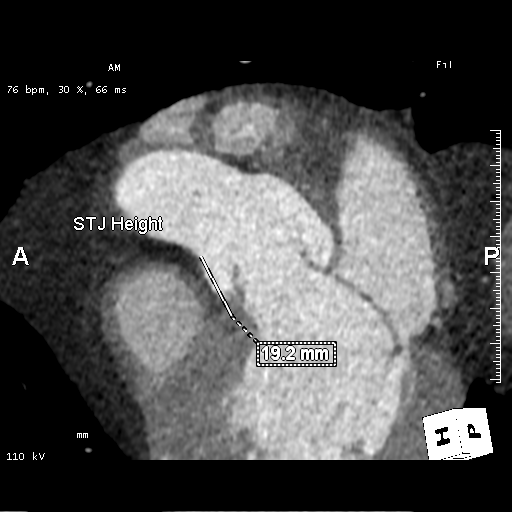
[im 6/10  vessel]
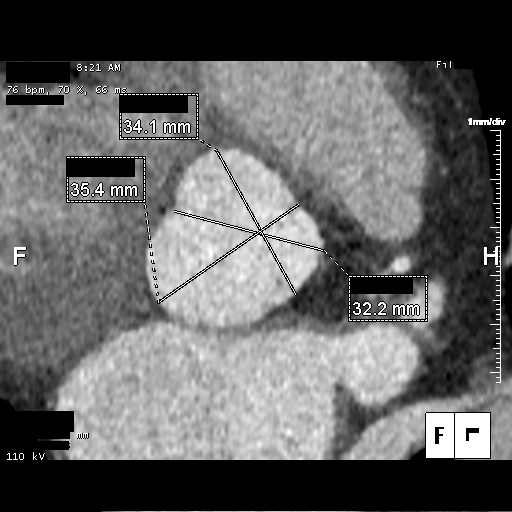
[im 6/10  lung]
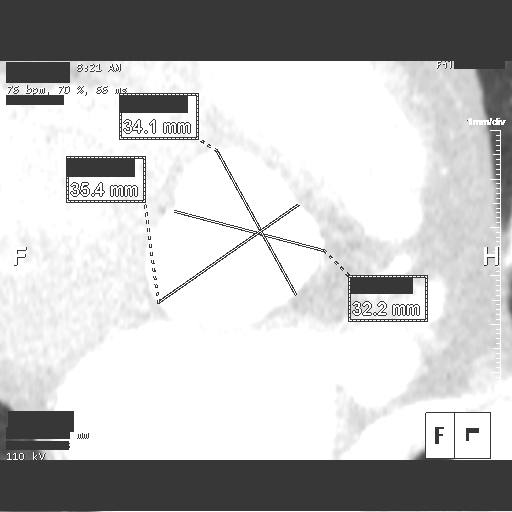
[im 7/10  vessel]
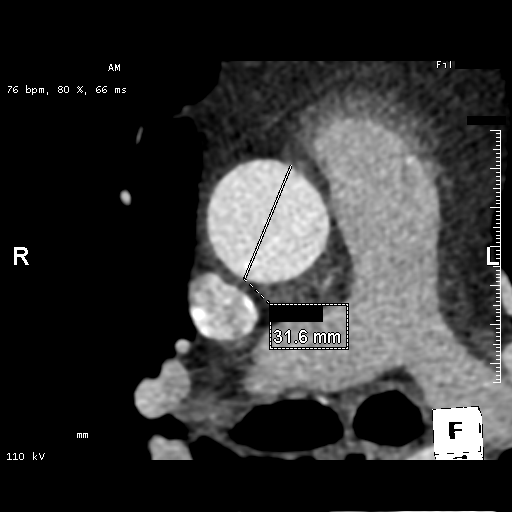
[im 8/10  vessel]
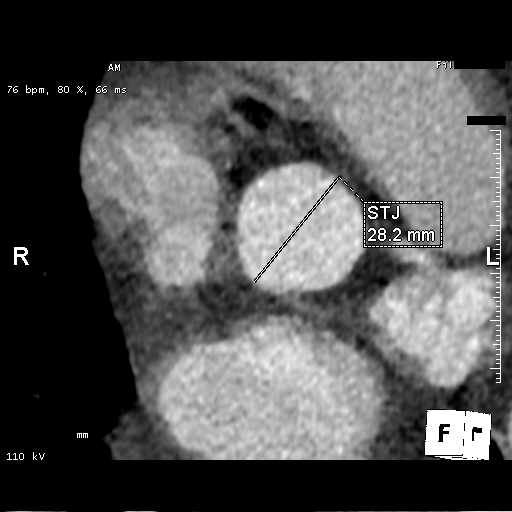
[im 9/10  vessel]
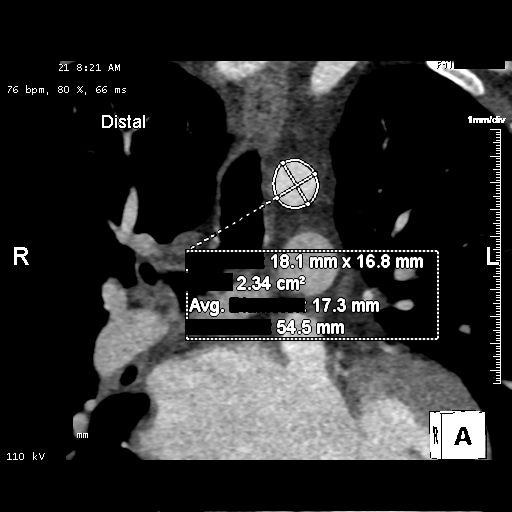

[8 of 20 positions shown; findings below may reference images not displayed]

FINDINGS: Please see the separate concurrent chest CT angiogram report for
details.
IMPRESSION: Please see the separate concurrent chest CT angiogram report for
details.
FINDINGS: Image quality: Average.

Noise artifact is: Mild signal to noise artifact due to obesity (BMI
38).

Valve Morphology: The aortic valve is bicuspid with fusion of the
RCC/LCC. There appears to be a raphe present (Chaparro type 1). There
is no aortic valve leaflet calcification. There is severe prolapse
of the RCC/LCC leaflet which is the etiology of severe aortic
regurgitation.

Aortic Valve Calcium score: 0

Aortic annular dimension:

Phase assessed: 30%

Annular area: 6.58 cm2

Annular perimeter: 96 mm

Max diameter: 35.6 mm

Min diameter: 25.6 mm

Annular and subannular calcification: No annular calcification. No
subannular calcification.

Optimal coplanar projection:

LAO 9 RODKINAS 2

Coronary Artery Height above Annulus:

Left Main: 8.0 mm

Right Coronary: 19 mm

Sinus of Valsalva Measurements:

Non-coronary: 35 mm

Right -coronary: 34 mm

Left -coronary: 32 mm

Sinus of Valsalva Height: 19.2 mm

Aorta:

Sinotubular Junction: 28 mm

Ascending Thoracic Aorta: 32 mm

Aortic Arch: Narrowing noted in the distal arch at the take off of
the left subclavian, measuring 18 mm x 17 mm. This is concerning for
re-coarctation given this patient's history of prior coarctation
repair. Echocardiogram reviewed with peak gradient ~21 mmHG across
this area, further supporting re-coarctation. I suspect this is a
prior extended end-to-end anastomosis, as there is no evidence of
prior stent or flap repair.

Descending Aorta: 27 mm directly after take off of left subclavian.
No evidence of coarctation in the distal descending aorta.

Coronary Arteries: CAC score of 0. Normal coronary origin. Left
dominance. The study was performed without use of NTG and is
insufficient for plaque evaluation.

Cardiac Morphology:

Right Atrium: Right atrial size is within normal limits.

Right Ventricle: The right ventricular cavity is within normal
limits.

Left Atrium: Left atrial size is normal in size with no left atrial
appendage filling defect. Accessory left atrial appendage noted.

Left Ventricle: The ventricular cavity size is within normal limits.
There are no stigmata of prior infarction. There is no abnormal
filling defect. LVEF=54%.

Pulmonary arteries: Normal in size without proximal filling defect.

Pulmonary veins: Normal pulmonary venous drainage.

Pericardium: Normal thickness with no significant effusion or
calcium present.

Mitral Valve: The mitral valve is normal structure without
significant calcification. Billowing noted in systole.

Extra-cardiac findings: See attached radiology report for
non-cardiac structures.
IMPRESSION: 1. Bicuspid aortic valve with severe prolapse of the fused RCC/LCC.

2. Narrowing (18 mm 17 mm) noted in the distal aortic arch at the
left subclavian take-off concerning for possible re-coarctation in
this patient with reported prior coarctation repair (echo gradient
noted to be ~21 mmHG).

3. Annular measurements support a 29 mm Abrashev Sapient 3, but the
left main coronary height is borderline (8 mm). Optimal deployment
angle above.

4. Coronary calcium score of 0. The study was performed without use
of NTG and is insufficient for plaque evaluation.

*** End of Addendum ***
EXAM:
OVER-READ INTERPRETATION  CT CHEST

The following report is an over-read performed by radiologist Dr.
Rajbira Pilava [REDACTED] on 12/17/2019. This over-read
does not include interpretation of cardiac or coronary anatomy or
pathology. The coronary CTA interpretation by the cardiologist is
attached.
FINDINGS: Please see the separate concurrent chest CT angiogram report for
details.
IMPRESSION: Please see the separate concurrent chest CT angiogram report for
details.

## 2021-07-17 ENCOUNTER — Encounter: Payer: Self-pay | Admitting: Thoracic Surgery (Cardiothoracic Vascular Surgery)

## 2021-12-07 ENCOUNTER — Ambulatory Visit: Payer: 59 | Admitting: Family Medicine

## 2022-11-20 ENCOUNTER — Telehealth: Payer: Self-pay | Admitting: Cardiology

## 2022-11-20 NOTE — Telephone Encounter (Signed)
   Name: Dean Molina  DOB: May 28, 1976  MRN: [id number]  Primary Cardiologist: Berniece Salines, DO  Chart reviewed as part of pre-operative protocol coverage. Because of Dean Molina past medical history and time since last visit, he will require a follow-up in-office visit in order to better assess preoperative cardiovascular risk.  Pre-op covering staff: - Please schedule appointment and call patient to inform them. If patient already had an upcoming appointment within acceptable timeframe, please add "pre-op clearance" to the appointment notes so provider is aware. - Please contact requesting surgeon's office via preferred method (i.e, phone, fax) to inform them of need for appointment prior to surgery.  Guidance for holding ASA will be provided at preoperative appointment.  Mable Fill, Marissa Nestle, NP  11/20/2022, 1:09 PM

## 2022-11-20 NOTE — Telephone Encounter (Signed)
   Pre-operative Risk Assessment    Patient Name: Dean Molina  DOB: May 13, 1976 MRN: [id number]      Request for Surgical Clearance    Procedure:   Hernia Repair  Date of Surgery:  Clearance TBD                                 Surgeon:  Dr. Louanna Raw Surgeon's Group or Practice Name:  [facility]  Phone number:  [phone] Fax number:  [phone]   Type of Clearance Requested:   - Medical  - Pharmacy:  Hold Aspirin TBD by Cardiology    Type of Anesthesia:  General    Additional requests/questions:    Crist Infante   11/20/2022, 11:48 AM

## 2022-11-20 NOTE — Telephone Encounter (Signed)
Left message to call back. Pt needs appt in office for cardiac clearance.

## 2022-11-22 NOTE — Telephone Encounter (Signed)
2nd attempt to reach pt regarding surgical clearance and the need for an IN-OFFICE appointment.  Left message for pt to call back and get that scheduled.

## 2022-11-27 NOTE — Telephone Encounter (Signed)
I s/w the pt about appt for pre op clearance. Pt tells me that he is going to post pone his surgery for some time, he is not in any rush to have the surgery per the pt. Pt did say that he needs to see Dr. Harriet Masson for his yrly appt. Pt is actually over due as he was last seen 2021. Pt has been scheduled to see Dr. Harriet Masson 01/29/23 @ 8 am. Pt thanked me for the help. I did ask the pt if he wanted me to make appt with Dr. Harriet Masson for pre op clearance and yrly appt. Pt answered no the pre op and keep it as his yrly f/y appt.   In regard to  clearance I will send FYI to surgeon office.

## 2022-12-25 ENCOUNTER — Other Ambulatory Visit: Payer: Self-pay | Admitting: Family Medicine

## 2022-12-25 DIAGNOSIS — N6313 Unspecified lump in the right breast, lower outer quadrant: Secondary | ICD-10-CM

## 2023-01-17 ENCOUNTER — Ambulatory Visit
Admission: RE | Admit: 2023-01-17 | Discharge: 2023-01-17 | Disposition: A | Discharge status: Home / Self Care | Payer: 59 | Source: Ambulatory Visit | Attending: Family Medicine | Admitting: Family Medicine

## 2023-01-17 DIAGNOSIS — N6313 Unspecified lump in the right breast, lower outer quadrant: Secondary | ICD-10-CM

## 2023-01-29 ENCOUNTER — Ambulatory Visit: Payer: 59 | Attending: Cardiology | Admitting: Cardiology

## 2023-01-29 ENCOUNTER — Encounter: Payer: Self-pay | Admitting: Cardiology

## 2023-01-29 VITALS — BP 126/82 | HR 81 | Ht 66.0 in | Wt 233.6 lb

## 2023-01-29 DIAGNOSIS — Z8679 Personal history of other diseases of the circulatory system: Secondary | ICD-10-CM

## 2023-01-29 DIAGNOSIS — E669 Obesity, unspecified: Secondary | ICD-10-CM

## 2023-01-29 DIAGNOSIS — E782 Mixed hyperlipidemia: Secondary | ICD-10-CM | POA: Diagnosis not present

## 2023-01-29 DIAGNOSIS — Z9889 Other specified postprocedural states: Secondary | ICD-10-CM | POA: Diagnosis not present

## 2023-01-29 DIAGNOSIS — I10 Essential (primary) hypertension: Secondary | ICD-10-CM

## 2023-01-29 NOTE — Progress Notes (Addendum)
Cardiology Office Note:    Date:  01/29/2023   ID:  Dean Molina, DOB 1975/12/06, MRN [id number]  PCP:  Patient, No Pcp Per  Cardiologist:  Berniece Salines, DO  Electrophysiologist:  None   Referring MD: No ref. provider found   " I am ok"  History of Present Illness:    Dean Molina is a 47 y.o. male with a hx of hypertension, bicuspid aortic valve with severe aortic regurgitation status post aortic valve repair with biostable HAART aortic ring anoplasty, complex valvuloplasty including release of single raphe free edge plication repair of bileaflet prolapse on July 27, 2020, status post coarctation repair when the patient was 3 months, congenital pulmonic stenosis status post balloon valvuloplasty at age 76, obesity and hyperlipidemia.   I last saw the patient on 10/25/2020 at that time he appeared to be     doing well from a cardiovascular standpoint.  Today he offers no complaints.  He tells me he has been doing well.  He has been actively exercising.                                                Past Medical History:  Diagnosis Date   Acid reflux    Aortic insufficiency due to bicuspid aortic valve    Bicuspid aortic valve    Coarctation of the aorta, s/p repair    Essential hypertension 10/12/2019   GERD (gastroesophageal reflux disease)    Heart murmur    Hyperlipidemia    Hypertension    Mixed hyperlipidemia 10/12/2019   Nonrheumatic aortic valve insufficiency 10/12/2019   Obesity (BMI 30-39.9)    pulmonic valve stenosis s/p balloon valvuloplasty    S/P aortic valve repair 07/27/2020   Complex valvuloplasty including release of raphe, free edge plication prolapse repair, and 23 mm HAART Biostable ring annuloplasty   S/P repair of coarctation of aorta     Past Surgical History:  Procedure Laterality Date   AORTIC VALVE REPAIR N/A 07/27/2020   Procedure: AORTIC VALVE REPAIR USING HAART 200 AORTIC ANNULOPLASTY DEVICE SIZE 23MM;  Surgeon: Rexene Alberts, MD;   Location: [facility];  Service: Open Heart Surgery;  Laterality: N/A;  [place] only   CARDIAC CATHETERIZATION  1988   PERCUTANEOUS BALLOON VALVULOPLASTY     pulmonic valve   status repair of coarctation of the aorta     TEE WITHOUT CARDIOVERSION N/A 12/23/2019   Procedure: TRANSESOPHAGEAL ECHOCARDIOGRAM (TEE);  Surgeon: Sanda Klein, MD;  Location: [facility];  Service: Cardiovascular;  Laterality: N/A;   TEE WITHOUT CARDIOVERSION N/A 07/27/2020   Procedure: TRANSESOPHAGEAL ECHOCARDIOGRAM (TEE);  Surgeon: Rexene Alberts, MD;  Location: [facility];  Service: Open Heart Surgery;  Laterality: N/A;   WISDOM TOOTH EXTRACTION      Current Medications: Current Meds  Medication Sig   aspirin EC 325 MG EC tablet Take 1 tablet (325 mg total) by mouth daily.   lovastatin (MEVACOR) 40 MG tablet Take 40 mg by mouth daily.    metoprolol tartrate (LOPRESSOR) 50 MG tablet Take 1 tablet (50 mg total) by mouth 2 (two) times daily.   MOUNJARO 2.5 MG/0.5ML Pen Inject 2.5 mg into the skin once a week.   omeprazole (PRILOSEC) 20 MG capsule Take 20 mg by mouth daily.    traMADol (ULTRAM) 50 MG tablet Take 1 tablet (50 mg total) by mouth every  6 (six) hours as needed for moderate pain.   valsartan-hydrochlorothiazide (DIOVAN-HCT) 160-25 MG tablet Take 1 tablet by mouth daily.      Allergies:   Tamiflu [oseltamivir phosphate]   Social History   Socioeconomic History   Marital status: Legally Separated    Spouse name: Not on file   Number of children: Not on file   Years of education: Not on file   Highest education level: Not on file  Occupational History   Not on file  Tobacco Use   Smoking status: Former    Types: Cigarettes    Quit date: 10/11/2009    Years since quitting: 13.3   Smokeless tobacco: Never  Vaping Use   Vaping Use: Never used  Substance and Sexual Activity   Alcohol use: Not Currently    Comment: Rarely   Drug use: Never   Sexual activity: Not on file  Other Topics Concern   Not on  file  Social History Narrative   Not on file   Social Determinants of Health   Financial Resource Strain: Not on file  Food Insecurity: Not on file  Transportation Needs: Not on file  Physical Activity: Not on file  Stress: Not on file  Social Connections: Not on file     Family History: The patient's family history includes Arthritis in his mother; Diabetes in his father; Hypertension in his father and mother.  ROS:   Review of Systems  Constitution: Negative for decreased appetite, fever and weight gain.  HENT: Negative for congestion, ear discharge, hoarse voice and sore throat.   Eyes: Negative for discharge, redness, vision loss in right eye and visual halos.  Cardiovascular: Negative for chest pain, dyspnea on exertion, leg swelling, orthopnea and palpitations.  Respiratory: Negative for cough, hemoptysis, shortness of breath and snoring.   Endocrine: Negative for heat intolerance and polyphagia.  Hematologic/Lymphatic: Negative for bleeding problem. Does not bruise/bleed easily.  Skin: Negative for flushing, nail changes, rash and suspicious lesions.  Musculoskeletal: Negative for arthritis, joint pain, muscle cramps, myalgias, neck pain and stiffness.  Gastrointestinal: Negative for abdominal pain, bowel incontinence, diarrhea and excessive appetite.  Genitourinary: Negative for decreased libido, genital sores and incomplete emptying.  Neurological: Negative for brief paralysis, focal weakness, headaches and loss of balance.  Psychiatric/Behavioral: Negative for altered mental status, depression and suicidal ideas.  Allergic/Immunologic: Negative for HIV exposure and persistent infections.    EKGs/Labs/Other Studies Reviewed:    The following studies were reviewed today:   EKG:  The ekg ordered today demonstrates sinus rhythm first-degree AV block with underlying left bundle branch block, left bundle branch block is not new present in 2021.  Tte  01/14/2020 IMPRESSIONS     1. Left ventricular ejection fraction, by estimation, is 55 to 60%. The  left ventricle has normal function. The left ventricle has no regional  wall motion abnormalities. There is mild concentric left ventricular  hypertrophy. Left ventricular diastolic  parameters are consistent with Grade I diastolic dysfunction (impaired  relaxation). Elevated left atrial pressure.   2. Right ventricular systolic function is normal. The right ventricular  size is normal.   3. Left atrial size was mildly dilated.   4. The mitral valve is normal in structure. Mild mitral valve  regurgitation. No evidence of mitral stenosis.   5. The aortic valve has been repaired/replaced. Aortic valve  regurgitation is trivial. No aortic stenosis is present. There is a 23 mm  HAART Biostable Ring valve present in the aortic position. Procedure  Date:  07/27/2020. Echo findings are consistent  with normal structure and function of the aortic valve prosthesis. Aortic  valve mean gradient measures 18.0 mmHg.   6. The inferior vena cava is normal in size with greater than 50%  respiratory variability, suggesting right atrial pressure of 3 mmHg.   FINDINGS   Left Ventricle: Left ventricular ejection fraction, by estimation, is 55  to 60%. The left ventricle has normal function. The left ventricle has no  regional wall motion abnormalities. Definity contrast agent was given IV  to delineate the left ventricular   endocardial borders. The left ventricular internal cavity size was normal  in size. There is mild concentric left ventricular hypertrophy. Left  ventricular diastolic parameters are consistent with Grade I diastolic  dysfunction (impaired relaxation).  Elevated left atrial pressure.   Right Ventricle: The right ventricular size is normal. No increase in  right ventricular wall thickness. Right ventricular systolic function is  normal.   Left Atrium: Left atrial size was mildly  dilated.   Right Atrium: Right atrial size was normal in size.   Pericardium: There is no evidence of pericardial effusion.   Mitral Valve: The mitral valve is normal in structure. Mild mitral valve  regurgitation. No evidence of mitral valve stenosis.   Tricuspid Valve: The tricuspid valve is normal in structure. Tricuspid  valve regurgitation is not demonstrated. No evidence of tricuspid  stenosis.   Aortic Valve: The aortic valve has been repaired/replaced. Aortic valve  regurgitation is trivial. No aortic stenosis is present. Aortic valve mean  gradient measures 18.0 mmHg. Aortic valve peak gradient measures 22.5  mmHg. There is a 23 mm HAART  Biostable Ring valve present in the aortic position. Procedure Date:  07/27/2020. Echo findings are consistent with normal structure and function  of the aortic valve prosthesis.   Pulmonic Valve: The pulmonic valve was normal in structure. Pulmonic valve  regurgitation is not visualized. No evidence of pulmonic stenosis.   Aorta: The aortic root is normal in size and structure.   Venous: The inferior vena cava is normal in size with greater than 50%  respiratory variability, suggesting right atrial pressure of 3 mmHg.   IAS/Shunts: No atrial level shunt detected by color flow Doppler.       Recent Labs: No results found for requested labs within last 365 days.  Recent Lipid Panel No results found for: "CHOL", "TRIG", "HDL", "CHOLHDL", "VLDL", "LDLCALC", "LDLDIRECT"  Physical Exam:    VS:  BP 126/82   Pulse 81   Ht 5\' 6"  (1.676 m)   Wt 233 lb 9.6 oz (106 kg)   SpO2 95%   BMI 37.70 kg/m     Wt Readings from Last 3 Encounters:  01/29/23 233 lb 9.6 oz (106 kg)  10/25/20 231 lb 1.6 oz (104.8 kg)  08/22/20 224 lb 9.6 oz (101.9 kg)     GEN: Well nourished, well developed in no acute distress HEENT: Normal NECK: No JVD; No carotid bruits LYMPHATICS: No lymphadenopathy CARDIAC: S1S2 noted,RRR, no murmurs, rubs,  gallops RESPIRATORY:  Clear to auscultation without rales, wheezing or rhonchi  ABDOMEN: Soft, non-tender, non-distended, +bowel sounds, no guarding. EXTREMITIES: No edema, No cyanosis, no clubbing MUSCULOSKELETAL:  No deformity  SKIN: Warm and dry NEUROLOGIC:  Alert and oriented x 3, non-focal PSYCHIATRIC:  Normal affect, good insight  ASSESSMENT:    1. Hypertension, unspecified type   2. S/P aortic valve repair   3. Essential hypertension   4. Mixed hyperlipidemia  5. Obesity (BMI 30-39.9)   6. pulmonic valve stenosis s/p balloon valvuloplasty    PLAN:    He appears to be doing well from a cardiovascular standpoint.  Will get an echo to assess his valve.   I advised the patient that moving forward for dental cleanings and procedures he will need prophylaxis antibiotics.  Expresses understanding and tells me he currently has amoxicillin which was prescribed by CT surgery for his upcoming March dental cleaning.   The patient understands the need to lose weight with diet and exercise. We have discussed specific strategies for this.    The patient is in agreement with the above plan. The patient left the office in stable condition.  The patient will follow up in 1 year or sooner if needed.   Medication Adjustments/Labs and Tests Ordered: Current medicines are reviewed at length with the patient today.  Concerns regarding medicines are outlined above.  Orders Placed This Encounter  Procedures   EKG 12-Lead   ECHOCARDIOGRAM COMPLETE   No orders of the defined types were placed in this encounter.   Patient Instructions  Medication Instructions:  Your physician recommends that you continue on your current medications as directed. Please refer to the Current Medication list given to you today.  *If you need a refill on your cardiac medications before your next appointment, please call your pharmacy*   Lab Work: None   Testing/Procedures: Your physician has requested that  you have an echocardiogram. Echocardiography is a painless test that uses sound waves to create images of your heart. It provides your doctor with information about the size and shape of your heart and how well your heart's chambers and valves are working. This procedure takes approximately one hour. There are no restrictions for this procedure. Please do NOT wear cologne, perfume, aftershave, or lotions (deodorant is allowed). Please arrive 15 minutes prior to your appointment time.     Follow-Up: At [facility], you and your health needs are our priority.  As part of our continuing mission to provide you with exceptional heart care, we have created designated Provider Care Teams.  These Care Teams include your primary Cardiologist (physician) and Advanced Practice Providers (APPs -  Physician Assistants and Nurse Practitioners) who all work together to provide you with the care you need, when you need it.  Your next appointment:   1 year(s)  Provider:   Berniece Salines, DO       Adopting a Healthy Lifestyle.  Know what a healthy weight is for you (roughly BMI <25) and aim to maintain this   Aim for 7+ servings of fruits and vegetables daily   65-80+ fluid ounces of water or unsweet tea for healthy kidneys   Limit to max 1 drink of alcohol per day; avoid smoking/tobacco   Limit animal fats in diet for cholesterol and heart health - choose grass fed whenever available   Avoid highly processed foods, and foods high in saturated/trans fats   Aim for low stress - take time to unwind and care for your mental health   Aim for 150 min of moderate intensity exercise weekly for heart health, and weights twice weekly for bone health   Aim for 7-9 hours of sleep daily   When it comes to diets, agreement about the perfect plan isnt easy to find, even among the experts. Experts at the [employer] developed an idea known as the Healthy Eating Plate. Just imagine a  plate divided  into logical, healthy portions.   The emphasis is on diet quality:   Load up on vegetables and fruits - one-half of your plate: Aim for color and variety, and remember that potatoes dont count.   Go for whole grains - one-quarter of your plate: Whole wheat, barley, wheat berries, quinoa, oats, brown rice, and foods made with them. If you want pasta, go with whole wheat pasta.   Protein power - one-quarter of your plate: Fish, chicken, beans, and nuts are all healthy, versatile protein sources. Limit red meat.   The diet, however, does go beyond the plate, offering a few other suggestions.   Use healthy plant oils, such as olive, canola, soy, corn, sunflower and peanut. Check the labels, and avoid partially hydrogenated oil, which have unhealthy trans fats.   If youre thirsty, drink water. Coffee and tea are good in moderation, but skip sugary drinks and limit milk and dairy products to one or two daily servings.   The type of carbohydrate in the diet is more important than the amount. Some sources of carbohydrates, such as vegetables, fruits, whole grains, and beans-are healthier than others.   Finally, stay active  Rolly Pancake, DO  01/29/2023 8:43 AM    [place]

## 2023-01-29 NOTE — Patient Instructions (Addendum)
Medication Instructions:  Your physician recommends that you continue on your current medications as directed. Please refer to the Current Medication list given to you today.  *If you need a refill on your cardiac medications before your next appointment, please call your pharmacy*   Lab Work: None  Testing/Procedures: Your physician has requested that you have an echocardiogram. Echocardiography is a painless test that uses sound waves to create images of your heart. It provides your doctor with information about the size and shape of your heart and how well your heart's chambers and valves are working. This procedure takes approximately one hour. There are no restrictions for this procedure. Please do NOT wear cologne, perfume, aftershave, or lotions (deodorant is allowed). Please arrive 15 minutes prior to your appointment time.    Follow-Up: At [facility], you and your health needs are our priority.  As part of our continuing mission to provide you with exceptional heart care, we have created designated Provider Care Teams.  These Care Teams include your primary Cardiologist (physician) and Advanced Practice Providers (APPs -  Physician Assistants and Nurse Practitioners) who all work together to provide you with the care you need, when you need it.  Your next appointment:   1 year(s)  Provider:   Kardie Tobb, DO   

## 2023-02-21 ENCOUNTER — Ambulatory Visit (HOSPITAL_COMMUNITY): Payer: 59

## 2023-03-12 ENCOUNTER — Telehealth: Payer: Self-pay

## 2023-03-12 NOTE — Telephone Encounter (Signed)
   Pre-operative Risk Assessment    Patient Name: Dean Molina  DOB: Aug 18, 1976 MRN: [id number]      Request for Surgical Clearance{ Patient Name: Dean Molina  DOB: 09-25-1976 MRN: [id number]        Request for Surgical Clearance     Procedure:   Hernia Repair   Date of Surgery:  Clearance TBD                                 Surgeon:  Dr. Ivar Drape Surgeon's Group or Practice Name:  [facility]  Phone number:  [phone] Fax number:  [phone]   Type of Clearance Requested:   - Medical  - Pharmacy:  Hold Aspirin TBD by Cardiology    Type of Anesthesia:  General   SignedReynolds Bowl   03/12/2023, 5:13 PM

## 2023-03-20 ENCOUNTER — Ambulatory Visit (HOSPITAL_COMMUNITY): Payer: 59 | Attending: Cardiology

## 2023-03-20 DIAGNOSIS — Z9889 Other specified postprocedural states: Secondary | ICD-10-CM | POA: Insufficient documentation

## 2023-03-20 LAB — ECHOCARDIOGRAM COMPLETE
AV Mean grad: 11 mmHg
AV Peak grad: 18.3 mmHg
Ao pk vel: 2.14 m/s
Area-P 1/2: 5.75 cm2
S' Lateral: 4.2 cm

## 2023-03-20 NOTE — Progress Notes (Unsigned)
Patient preferred not to have Definity administered at this time.

## 2023-03-21 NOTE — Telephone Encounter (Signed)
   Primary Cardiologist: Thomasene Ripple, DO  Chart reviewed as part of pre-operative protocol coverage. Given past medical history and time since last visit, based on ACC/AHA guidelines, Kaceton Jha would be at acceptable risk for the planned procedure without further cardiovascular testing.   Patient was advised that if he develops new symptoms prior to surgery to contact our office to arrange a follow-up appointment.  He verbalized understanding.  Per office protocol, he may hold aspirin for 5-7 days prior to procedure and should resume as soon as hemodynamically stable postoperatively.  I will route this recommendation to the requesting party via Epic fax function and remove from pre-op pool.  Please call with questions.  Levi Aland, NP-C  03/21/2023, 1:05 PM [address] Office [phone] Fax [phone]

## 2024-09-16 ENCOUNTER — Encounter: Payer: Self-pay | Admitting: Cardiology
